# Patient Record
Sex: Male | Born: 1960
Health system: Midwestern US, Community
[De-identification: ages and names within clinical notes are randomized; demographics above are authoritative.]

## PROBLEM LIST (undated history)

## (undated) DIAGNOSIS — E079 Disorder of thyroid, unspecified: Secondary | ICD-10-CM

## (undated) DIAGNOSIS — K5792 Diverticulitis of intestine, part unspecified, without perforation or abscess without bleeding: Secondary | ICD-10-CM

## (undated) DIAGNOSIS — I10 Essential (primary) hypertension: Secondary | ICD-10-CM

## (undated) DIAGNOSIS — J45909 Unspecified asthma, uncomplicated: Secondary | ICD-10-CM

## (undated) DIAGNOSIS — K219 Gastro-esophageal reflux disease without esophagitis: Secondary | ICD-10-CM

## (undated) HISTORY — PX: ROTATOR CUFF REPAIR: SHX139

---

## 2002-09-14 ENCOUNTER — Encounter: Payer: Self-pay | Admitting: Internal Medicine

## 2002-09-14 ENCOUNTER — Encounter: Admission: RE | Admit: 2002-09-14 | Discharge: 2002-09-14 | Payer: Self-pay | Admitting: Internal Medicine

## 2015-06-14 ENCOUNTER — Ambulatory Visit: Admit: 2015-06-14 | Payer: MEDICARE | Attending: Anesthesiology

## 2015-06-14 DIAGNOSIS — M5412 Radiculopathy, cervical region: Secondary | ICD-10-CM

## 2015-06-14 NOTE — Progress Notes (Signed)
Frank Richardson  Dec 25, 1960  [id number]    HPI Comments: Pain returning after 2 mos S/P epidurals.Pain still gone in right arm, left persists. Here to discuss options.    Neck Pain    This is a chronic problem. The current episode started more than 1 year ago. The problem occurs daily. The problem has been gradually worsening. The pain is associated with nothing. The pain is present in the midline, left side and right side. The quality of the pain is described as stabbing and aching. The pain is at a severity of 3/10. The pain is mild. The symptoms are aggravated by position. The pain is same all the time. Stiffness is present all day. Pertinent negatives include no chest pain, headaches, numbness, tingling or weakness. Treatments tried: epidurals. The treatment provided moderate relief.     []  OARRS Reviewed  []  Consistent Results  []  Inconsistent Results    []  Drug Screen Consistent Results  []  Drug Screen Inconsistent Results      ALLERGIES: Patients list of allergies were reviewed     MEDICATIONS: Mr. Lipinski list of medications were reviewed.    SOCIAL/FAMILY/PAST MEDICAL HISTORY: Mr. Vanderwerf social, family and past medical history was reviewed.     REVIEW OF SYSTEMS:    Review of Systems   Constitutional: Negative for activity change, appetite change and fatigue.   Respiratory: Negative for apnea and shortness of breath.    Cardiovascular: Negative for chest pain.   Musculoskeletal: Positive for neck pain.   Neurological: Negative for tingling, weakness, numbness and headaches.   Psychiatric/Behavioral: Negative for agitation, behavioral problems, confusion, decreased concentration and sleep disturbance.          PHYSICAL EXAM:   Vitals reviewed.   Visit Vitals   ??? BP (!) 180/98   ??? Ht 5\' 10"  (1.778 m)   ??? Wt 198 lb (89.8 kg)   ??? BMI 28.41 kg/m2     Physical Exam   Constitutional: He appears well-developed and well-nourished. He appears distressed.   No signs of intoxication   Eyes: Pupils are equal, round, and  reactive to light.   Neck: Spinous process tenderness and muscular tenderness present. Rigidity present. Decreased range of motion present.   Cardiovascular: Normal rate and regular rhythm.    Murmur heard.  Pulmonary/Chest: Effort normal and breath sounds normal.   Musculoskeletal:        Cervical back: He exhibits decreased range of motion, tenderness, pain and spasm.   Neurological: He is alert. He has normal strength. He displays no atrophy. No sensory deficit. He exhibits normal muscle tone. He displays a negative Romberg sign. Gait abnormal.   Reflex Scores:       Tricep reflexes are 2+ on the right side and 2+ on the left side.       Bicep reflexes are 2+ on the right side and 2+ on the left side.       Brachioradialis reflexes are 2+ on the right side and 2+ on the left side.  Psychiatric: He has a normal mood and affect. His behavior is normal.   Vitals reviewed.      IMPRESSION:   1. Cervical radiculitis    2. Postlaminectomy syndrome, cervical region    3. Spinal stenosis in cervical region    4. Spondylosis of cervical region without myelopathy or radiculopathy        PLAN:    Discussed doing a trial of facet nerve blocks, SCS trial and surgical consultation. Will  proceed with facet blocks first.    Informed verbal consent was obtained     Current Outpatient Prescriptions   Medication Sig Dispense Refill   ??? aspirin 81 MG tablet Take 81 mg by mouth daily     ??? Levothyroxine Sodium 88 MCG CAPS Take by mouth Daily     ??? omeprazole (PRILOSEC) 20 MG capsule Take 20 mg by mouth daily     ??? atorvastatin (LIPITOR) 80 MG tablet Take 80 mg by mouth daily     ??? temazepam (RESTORIL) 15 MG capsule Take 15 mg by mouth nightly as needed for Sleep     ??? HYDROcodone-acetaminophen (NORCO) 10-325 MG per tablet Take 1 tablet by mouth every 6 hours as needed for Pain       No current facility-administered medications for this visit.      I will continue his current medication regimen  which is part of the above treatment  schedule. It has been helping with Mr. Stern chronic  medical problems which for this visit include:   The primary encounter diagnosis was Cervical radiculitis. Diagnoses of Postlaminectomy syndrome, cervical region, Spinal stenosis in cervical region, and Spondylosis of cervical region without myelopathy or radiculopathy were also pertinent to this visit.     He was advised home exercise program with a stretching/ROM routine as tolerated. Risks and benefits of the medications and other alternative treatments  including no treatment were discussed with the patient.The common side effects of these medications were also explained to the patient.  Informed verbal consent was obtained.   Goals of current treatment regimen include improvement in pain, restoration of functioning- with focus on improvement in physical performance, general activity, work or disability,emotional distress, health care utilization and  decreased medication consumption. Will continue to monitor progress towards achieving/maintaining therapeutic goals with special emphasis on  1. Improvement in perceived interfernce  of pain with ADL's. Ability to do home exercises independently. Ability to do household chores indoor and/or outdoor work and social and leisure activities.Improve psychosocial and physical functioning.   2.   There are no discontinued medications.    He was advised against drinking alcohol with the narcotic pain medicines, advised against driving or handling machinery while adjusting the dose of medicines or if having cognitive  issues related to the current medications.Risk of overdose and death, if medicines not taken as prescribed, were also discussed. If the patient develops new symptoms or if the symptoms worsen, the patient should call the office.    While transcribing every attempt was made to maintain the accuracy of the note in terms of it's contents,there may have been some errors made inadvertently.  Thank you for  allowing me to participate in the care of this patient.        Trampus Mcquerry J,Emslee Lopezmartinez MD.[email or web address].FIPP.    Cc: Dr.No primary care provider on file.

## 2017-12-29 ENCOUNTER — Emergency Department (HOSPITAL_BASED_OUTPATIENT_CLINIC_OR_DEPARTMENT_OTHER): Payer: BLUE CROSS/BLUE SHIELD

## 2017-12-29 ENCOUNTER — Emergency Department (HOSPITAL_BASED_OUTPATIENT_CLINIC_OR_DEPARTMENT_OTHER)
Admission: EM | Admit: 2017-12-29 | Discharge: 2017-12-29 | Disposition: A | Discharge status: Home / Self Care | Payer: BLUE CROSS/BLUE SHIELD | Attending: Emergency Medicine | Admitting: Emergency Medicine

## 2017-12-29 ENCOUNTER — Encounter (HOSPITAL_BASED_OUTPATIENT_CLINIC_OR_DEPARTMENT_OTHER): Payer: Self-pay | Admitting: Emergency Medicine

## 2017-12-29 ENCOUNTER — Other Ambulatory Visit: Payer: Self-pay

## 2017-12-29 DIAGNOSIS — R2241 Localized swelling, mass and lump, right lower limb: Secondary | ICD-10-CM | POA: Insufficient documentation

## 2017-12-29 DIAGNOSIS — J45901 Unspecified asthma with (acute) exacerbation: Secondary | ICD-10-CM | POA: Insufficient documentation

## 2017-12-29 DIAGNOSIS — M7989 Other specified soft tissue disorders: Secondary | ICD-10-CM

## 2017-12-29 DIAGNOSIS — I10 Essential (primary) hypertension: Secondary | ICD-10-CM | POA: Diagnosis not present

## 2017-12-29 DIAGNOSIS — J101 Influenza due to other identified influenza virus with other respiratory manifestations: Secondary | ICD-10-CM | POA: Diagnosis not present

## 2017-12-29 DIAGNOSIS — R0602 Shortness of breath: Secondary | ICD-10-CM | POA: Diagnosis present

## 2017-12-29 DIAGNOSIS — Z79899 Other long term (current) drug therapy: Secondary | ICD-10-CM | POA: Insufficient documentation

## 2017-12-29 DIAGNOSIS — J111 Influenza due to unidentified influenza virus with other respiratory manifestations: Secondary | ICD-10-CM

## 2017-12-29 DIAGNOSIS — R2242 Localized swelling, mass and lump, left lower limb: Secondary | ICD-10-CM | POA: Insufficient documentation

## 2017-12-29 HISTORY — DX: Gastro-esophageal reflux disease without esophagitis: K21.9

## 2017-12-29 HISTORY — DX: Disorder of thyroid, unspecified: E07.9

## 2017-12-29 HISTORY — DX: Essential (primary) hypertension: I10

## 2017-12-29 LAB — COMPREHENSIVE METABOLIC PANEL
ALBUMIN: 3.7 g/dL (ref 3.5–5.0)
ALK PHOS: 69 U/L (ref 38–126)
ALT: 26 U/L (ref 17–63)
AST: 29 U/L (ref 15–41)
Anion gap: 10 (ref 5–15)
BUN: 15 mg/dL (ref 6–20)
CHLORIDE: 100 mmol/L — AB (ref 101–111)
CO2: 27 mmol/L (ref 22–32)
CREATININE: 1.19 mg/dL (ref 0.61–1.24)
Calcium: 8.7 mg/dL — ABNORMAL LOW (ref 8.9–10.3)
GFR calc non Af Amer: >60 mL/min (ref >60)
GLUCOSE: 117 mg/dL — AB (ref 65–99)
Potassium: 3.8 mmol/L (ref 3.5–5.1)
SODIUM: 137 mmol/L (ref 135–145)
Total Bilirubin: 0.4 mg/dL (ref 0.3–1.2)
Total Protein: 7 g/dL (ref 6.5–8.1)

## 2017-12-29 LAB — CBC WITH DIFFERENTIAL/PLATELET
Basophils Absolute: 0 10*3/uL (ref 0.0–0.1)
Basophils Relative: 0 %
EOS ABS: 0.1 10*3/uL (ref 0.0–0.7)
Eosinophils Relative: 1 %
HEMATOCRIT: 47 % (ref 39.0–52.0)
HEMOGLOBIN: 15.6 g/dL (ref 13.0–17.0)
LYMPHS ABS: 1.2 10*3/uL (ref 0.7–4.0)
Lymphocytes Relative: 19 %
MCH: 27.2 pg (ref 26.0–34.0)
MCHC: 33.2 g/dL (ref 30.0–36.0)
MCV: 82 fL (ref 78.0–100.0)
MONO ABS: 0.8 10*3/uL (ref 0.1–1.0)
MONOS PCT: 13 %
Neutro Abs: 4.2 10*3/uL (ref 1.7–7.7)
Neutrophils Relative %: 67 %
Platelets: 191 10*3/uL (ref 150–400)
RBC: 5.73 MIL/uL (ref 4.22–5.81)
RDW: 14.9 % (ref 11.5–15.5)
WBC: 6.2 10*3/uL (ref 4.0–10.5)

## 2017-12-29 LAB — INFLUENZA PANEL BY PCR (TYPE A & B)
INFLAPCR: POSITIVE — AB
Influenza B By PCR: NEGATIVE

## 2017-12-29 LAB — I-STAT CG4 LACTIC ACID, ED: LACTIC ACID, VENOUS: 1.65 mmol/L (ref 0.5–1.9)

## 2017-12-29 MED ORDER — IPRATROPIUM BROMIDE 0.02 % IN SOLN
0.5000 mg | Freq: Once | RESPIRATORY_TRACT | Status: AC
  Administered 2017-12-29: 0.5 mg via RESPIRATORY_TRACT
  Filled 2017-12-29: qty 2.5

## 2017-12-29 MED ORDER — OSELTAMIVIR PHOSPHATE 75 MG PO CAPS
75.0000 mg | ORAL_CAPSULE | Freq: Once | ORAL | Status: AC
  Administered 2017-12-29: 75 mg via ORAL
  Filled 2017-12-29: qty 1

## 2017-12-29 MED ORDER — ALBUTEROL SULFATE (2.5 MG/3ML) 0.083% IN NEBU
2.5000 mg | INHALATION_SOLUTION | Freq: Once | RESPIRATORY_TRACT | Status: AC
  Administered 2017-12-29: 2.5 mg via RESPIRATORY_TRACT

## 2017-12-29 MED ORDER — SODIUM CHLORIDE 0.9 % IV BOLUS (SEPSIS)
1000.0000 mL | Freq: Once | INTRAVENOUS | Status: AC
  Administered 2017-12-29: 1000 mL via INTRAVENOUS

## 2017-12-29 MED ORDER — ACETAMINOPHEN 325 MG PO TABS
650.0000 mg | ORAL_TABLET | Freq: Once | ORAL | Status: AC
  Administered 2017-12-29: 650 mg via ORAL
  Filled 2017-12-29: qty 2

## 2017-12-29 MED ORDER — BENZONATATE 100 MG PO CAPS: 100.0000 mg | ORAL_CAPSULE | Freq: Three times a day (TID) | ORAL | 0 refills | Status: DC

## 2017-12-29 MED ORDER — ALBUTEROL SULFATE (2.5 MG/3ML) 0.083% IN NEBU
INHALATION_SOLUTION | RESPIRATORY_TRACT | Status: AC
  Administered 2017-12-29: 2.5 mg via RESPIRATORY_TRACT
  Filled 2017-12-29: qty 3

## 2017-12-29 MED ORDER — PREDNISONE 20 MG PO TABS: 40.0000 mg | ORAL_TABLET | Freq: Every day | ORAL | 0 refills | Status: DC

## 2017-12-29 MED ORDER — AEROCHAMBER PLUS FLO-VU SMALL MISC
1.0000 | Freq: Once | Status: AC
  Administered 2017-12-29: 1
  Filled 2017-12-29: qty 1

## 2017-12-29 MED ORDER — OSELTAMIVIR PHOSPHATE 75 MG PO CAPS: 75.0000 mg | ORAL_CAPSULE | Freq: Two times a day (BID) | ORAL | 0 refills | Status: DC

## 2017-12-29 MED ORDER — ALBUTEROL (5 MG/ML) CONTINUOUS INHALATION SOLN
10.0000 mg/h | INHALATION_SOLUTION | Freq: Once | RESPIRATORY_TRACT | Status: AC
  Administered 2017-12-29: 10 mg/h via RESPIRATORY_TRACT
  Filled 2017-12-29: qty 20

## 2017-12-29 MED ORDER — METHYLPREDNISOLONE SODIUM SUCC 125 MG IJ SOLR
125.0000 mg | Freq: Once | INTRAMUSCULAR | Status: AC
  Administered 2017-12-29: 125 mg via INTRAVENOUS
  Filled 2017-12-29: qty 2

## 2017-12-29 MED ORDER — IPRATROPIUM-ALBUTEROL 0.5-2.5 (3) MG/3ML IN SOLN
RESPIRATORY_TRACT | Status: AC
  Administered 2017-12-29: 3 mL via RESPIRATORY_TRACT
  Filled 2017-12-29: qty 3

## 2017-12-29 MED ORDER — IPRATROPIUM-ALBUTEROL 0.5-2.5 (3) MG/3ML IN SOLN
3.0000 mL | Freq: Once | RESPIRATORY_TRACT | Status: AC
  Administered 2017-12-29: 3 mL via RESPIRATORY_TRACT

## 2017-12-29 MED ORDER — ALBUTEROL SULFATE HFA 108 (90 BASE) MCG/ACT IN AERS
2.0000 | INHALATION_SPRAY | RESPIRATORY_TRACT | Status: DC | PRN
  Administered 2017-12-29: 2 via RESPIRATORY_TRACT
  Filled 2017-12-29: qty 6.7

## 2017-12-29 NOTE — ED Triage Notes (Signed)
SOB, cough, fever x 2 days.

## 2017-12-29 NOTE — ED Provider Notes (Signed)
[facility] EMERGENCY DEPARTMENT Provider Note   CSN: [id number] Arrival date & time: 12/29/17  1107     History   Chief Complaint Chief Complaint  Patient presents with  . Shortness of Breath    HPI Wayne RammingRobert M Gross is a 57 y.o. male.  Patient presents the emergency department today with complaints of shortness of breath, fevers over the past 3 days.  Patient also has a history of asthma but states that he has very infrequent exacerbations but does have albuterol at home.  Patient has had persistent coughing as well with pain across his lower chest with coughing.  No nausea, vomiting, or diarrhea.  Patient states that it feels like he cannot get enough air in.  No ear pain, runny nose, sore throat.  Patient does state that for several weeks he has had bilateral lower extremity swelling and tenderness after he sits down in the evening after working.  No history of blood clots.  He has not a smoker.  The onset of this condition was acute. The course is constant. Aggravating factors: none. Alleviating factors: none.        Past Medical History:  Diagnosis Date  . GERD (gastroesophageal reflux disease)   . Hypertension   . Thyroid disease     There are no active problems to display for this patient.   Past Surgical History:  Procedure Laterality Date  . ROTATOR CUFF REPAIR Right        Home Medications    Prior to Admission medications   Medication Sig Start Date End Date Taking? Authorizing Provider  levothyroxine (SYNTHROID, LEVOTHROID) 200 MCG tablet Take 200 mcg by mouth daily before breakfast.   Yes [provider]    Family History No family history on file.  Social History Social History   Tobacco Use  . Smoking status: Never Smoker  . Smokeless tobacco: Never Used  Substance Use Topics  . Alcohol use: Yes  . Drug use: Not on file     Allergies   Patient has no known allergies.   Review of Systems Review of Systems    Constitutional: Positive for chills, fatigue and fever.  HENT: Positive for congestion. Negative for rhinorrhea and sore throat.   Eyes: Negative for redness.  Respiratory: Positive for cough and shortness of breath.   Cardiovascular: Positive for leg swelling. Negative for chest pain.  Gastrointestinal: Negative for abdominal pain, diarrhea, nausea and vomiting.  Genitourinary: Negative for dysuria.  Musculoskeletal: Positive for myalgias.  Skin: Negative for rash.  Neurological: Negative for headaches.     Physical Exam Updated Vital Signs BP (!) 143/96   Pulse (!) 113   Temp 99.1 F (37.3 C) (Oral)   Resp 14   Ht 6' (1.829 m)   Wt 99.8 kg (220 lb)   SpO2 100%   BMI 29.84 kg/m   Physical Exam  Constitutional: He appears well-developed and well-nourished.  HENT:  Head: Normocephalic and atraumatic.  Right Ear: Tympanic membrane, external ear and ear canal normal.  Left Ear: Tympanic membrane, external ear and ear canal normal.  Nose: Mucosal edema present. No rhinorrhea.  Mouth/Throat: Uvula is midline, oropharynx is clear and moist and mucous membranes are normal. Mucous membranes are not dry. No trismus in the jaw. No uvula swelling. No oropharyngeal exudate, posterior oropharyngeal edema, posterior oropharyngeal erythema or tonsillar abscesses.  Eyes: Conjunctivae are normal. Right eye exhibits no discharge. Left eye exhibits no discharge.  Neck: Normal range of motion. Neck supple.  Cardiovascular: Regular rhythm and normal heart sounds.  No murmur heard. Tachycardic, just received albuterol treatment.  Pulmonary/Chest: Effort normal. No respiratory distress. He has wheezes (Moderate inspiratory/expiratory wheezing throughout). He has no rales. He exhibits tenderness (Over bilateral lower chest wall).  Abdominal: Soft. There is no tenderness. There is no rebound and no guarding.  Musculoskeletal: He exhibits no edema or tenderness.  No clinical signs of DVT.   Neurological: He is alert.  Skin: Skin is warm and dry.  Psychiatric: He has a normal mood and affect.  Nursing note and vitals reviewed.    ED Treatments / Results  Labs (all labs ordered are listed, but only abnormal results are displayed) Labs Reviewed  COMPREHENSIVE METABOLIC PANEL - Abnormal; Notable for the following components:      Result Value   Chloride 100 (*)    Glucose, Bld 117 (*)    Calcium 8.7 (*)    All other components within normal limits  INFLUENZA PANEL BY PCR (TYPE A & B) - Abnormal; Notable for the following components:   Influenza A By PCR POSITIVE (*)    All other components within normal limits  CBC WITH DIFFERENTIAL/PLATELET  I-STAT CG4 LACTIC ACID, ED    ED ECG REPORT   Date: 12/29/2017  Rate: 114  Rhythm: sinus tachycardia  QRS Axis: left  Intervals: normal  ST/T Wave abnormalities: normal  Conduction Disutrbances:none  Narrative Interpretation:   Old EKG Reviewed: none available  I have personally reviewed the EKG tracing and agree with the computerized printout as noted.  Radiology No results found.  Procedures Procedures (including critical care time)  Medications Ordered in ED Medications  ipratropium-albuterol (DUONEB) 0.5-2.5 (3) MG/3ML nebulizer solution 3 mL (3 mLs Nebulization Given 12/29/17 1132)  albuterol (PROVENTIL) (2.5 MG/3ML) 0.083% nebulizer solution 2.5 mg (2.5 mg Nebulization Given 12/29/17 1132)     Initial Impression / Assessment and Plan / ED Course  I have reviewed the triage vital signs and the nursing notes.  Pertinent labs & imaging results that were available during my care of the patient were reviewed by me and considered in my medical decision making (see chart for details).     Patient seen and examined. Work-up initiated. Medications ordered. Continued wheezing after 2 treatments.   Vital signs reviewed and are as follows: BP (!) 143/96   Pulse (!) 113   Temp 99.1 F (37.3 C) (Oral)   Resp 14    Ht 6' (1.829 m)   Wt 99.8 kg (220 lb)   SpO2 100%   BMI 29.84 kg/m   1:33 PM patient rechecked.  Continues wheezing and feeling short of breath.  Will place on continuous nebulizer treatment and give IV steroids.  Awaiting flu test and results of ultrasound.  Patient and wife informed of lab work and CXR results.   3:32 PM Pt completed hr long treatment. Ambulated with O2 sat at 95-96%. HR was 120 after albuterol, to 135 at end of ambulation.  Discussed patient with Dr. Clarene Duke who will see.   4:57 PM patient discussed with and seen by Dr. Clarene Duke.  Better after hour-long continuous nebulizer treatment.  He appears more comfortable.  Flu test is positive for influenza A.  Will start on Tamiflu.  Patient will be discharged home with Tessalon, albuterol inhaler, prednisone times 4 days.  Patient encouraged to rest for the next 2 days and follow-up with his doctor for recheck.  Patient discharged to home. Encouraged to rest and drink plenty of fluids.  Patient told to return to ED or see their primary doctor if their symptoms worsen, high fever not controlled with tylenol, persistent vomiting, they feel they are dehydrated, or if they have any other concerns.  Patient verbalized understanding and agreed with plan.      Final Clinical Impressions(s) / ED Diagnoses   Final diagnoses:  Exacerbation of asthma, unspecified asthma severity, unspecified whether persistent  Influenza   Patient with exacerbation of asthma and wheezing, shortness of breath in setting of influenza A.  Patient tested and treated as above.  At time of discharge he is maintaining good oxygen saturations with improved lung exam and subjective improvement.  Do not suspect ACS or PE.  Return instructions as above.  ED Discharge Orders        Ordered    benzonatate (TESSALON) 100 MG capsule  Every 8 hours     12/29/17 1642    predniSONE (DELTASONE) 20 MG tablet  Daily     12/29/17 1642    oseltamivir (TAMIFLU) 75 MG  capsule  Every 12 hours     12/29/17 1642       Renne Crigler, PA-C 12/29/17 1659    Renne Crigler, PA-C 12/29/17 1701    Little, Ambrose Finland, MD 12/30/17 1552

## 2017-12-29 NOTE — ED Notes (Signed)
ED Provider at bedside. 

## 2017-12-29 NOTE — ED Notes (Addendum)
AMBULATING: SpO2 95-96% and HR maintained 130-134bpm. PA aware.  Pt remains on cardiac monitoring and auto VS

## 2017-12-29 NOTE — ED Notes (Signed)
Pt on cardiac monitor and auto VS 

## 2017-12-29 NOTE — Discharge Instructions (Signed)
Please read and follow all provided instructions.  Your diagnoses today include:  1. Exacerbation of asthma, unspecified asthma severity, unspecified whether persistent   2. Leg swelling   3. Viral upper respiratory illness     Tests performed today include:  Chest x-ray -no pneumonia  Blood counts and electrolytes  Test for systemic sepsis -negative  Ultrasound for blood clots in the legs -no clots found  Test for flu -pending, I will call if your result is positive  Vital signs. See below for your results today.   Medications prescribed:   Albuterol inhaler - medication that opens up your airway  Use inhaler as follows: 1-2 puffs with spacer every 4 hours as needed for wheezing, cough, or shortness of breath.    Prednisone - steroid medicine   It is best to take this medication in the morning to prevent sleeping problems. If you are diabetic, monitor your blood sugar closely and stop taking Prednisone if blood sugar is over 300. Take with food to prevent stomach upset.    Tessalon Perles - cough suppressant medication   Tamiflu - medication for influenza  This medication, when taken within the first 48 hours of illness, may help decrease the severity of the flu and cause symptoms to improve approximately 12 hours sooner. About 1 out of 5 patients may have diarrhea and vomiting from this medication.   Take any prescribed medications only as directed.  Home care instructions:  Follow any educational materials contained in this packet.  BE VERY CAREFUL not to take multiple medicines containing Tylenol (also called acetaminophen). Doing so can lead to an overdose which can damage your liver and cause liver failure and possibly death.   Follow-up instructions: Please follow-up with your primary care provider in the next 2-3 days for further evaluation of your symptoms.   Return instructions:   Please return to the Emergency Department if you experience worsening  symptoms.   Return with chest pain, worsening shortness of breath, persistent vomiting, lightheadedness or passing out  Please return if you have any other emergent concerns.  Additional Information:  Your vital signs today were: BP 130/69 (BP Location: Left Arm)    Pulse (!) 120    Temp 99.1 F (37.3 C) (Oral)    Resp 20    Ht 6' (1.829 m)    Wt 99.8 kg (220 lb)    SpO2 97%    BMI 29.84 kg/m  If your blood pressure (BP) was elevated above 135/85 this visit, please have this repeated by your doctor within one month. --------------

## 2019-06-07 IMAGING — US US EXTREM LOW VENOUS BILAT
1 series · 13 of 24 positions shown · non-contrast
Comparison: None.

CLINICAL DATA: Bilateral calf swelling for 6 months.



[Series 1: us extrem low venous bilat · 0.08mm/px · 13 of 50 slices shown]
[im 1/50]
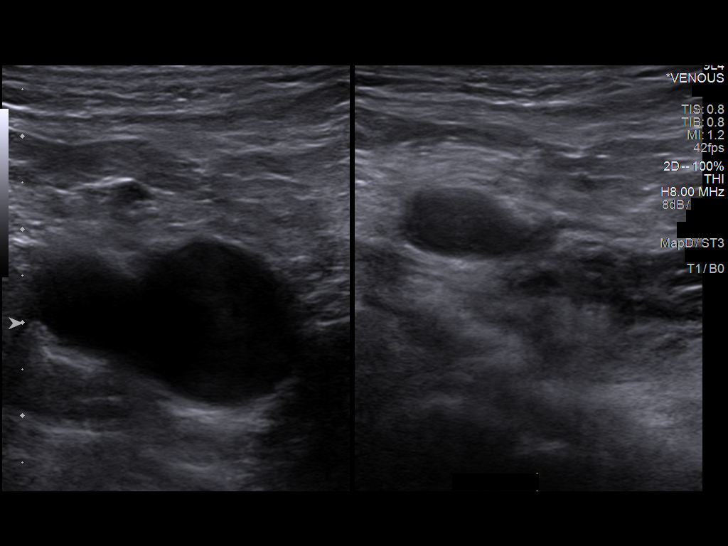
[im 5/50]
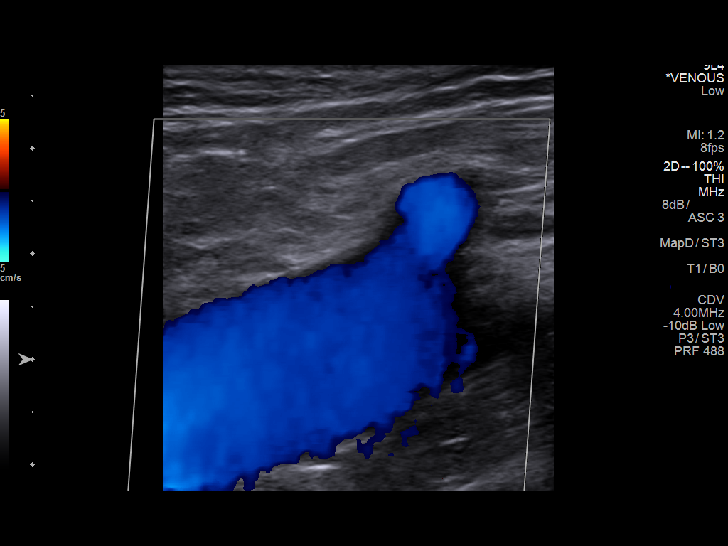
[im 9/50]
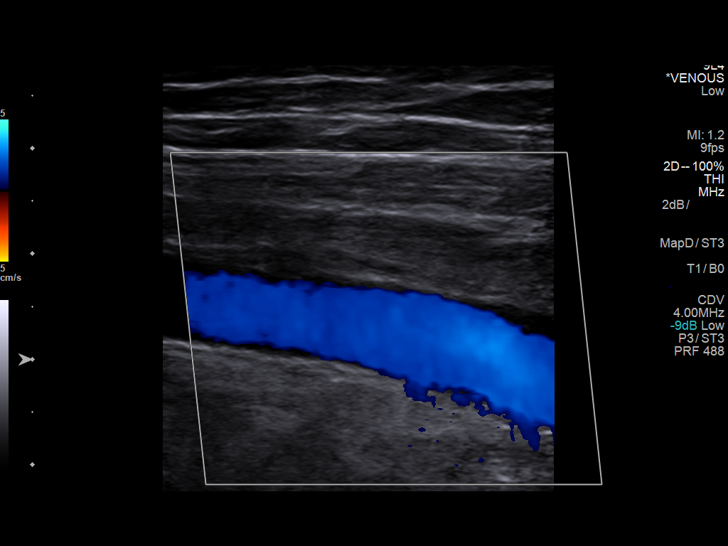
[im 13/50]
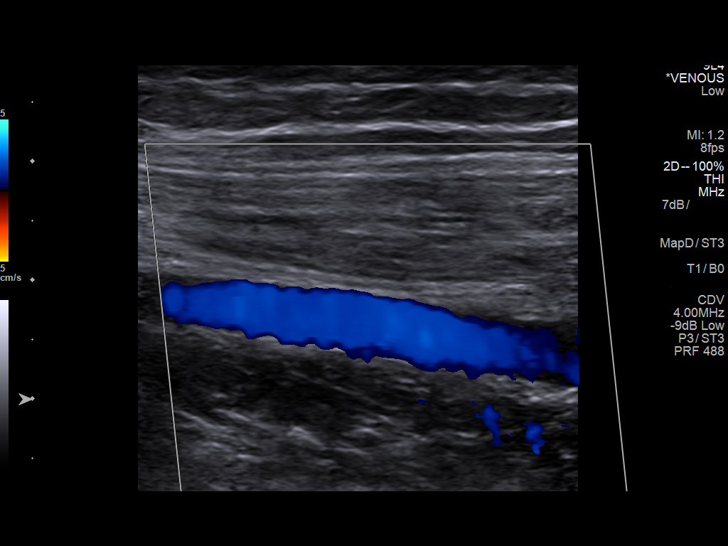
[im 18/50]
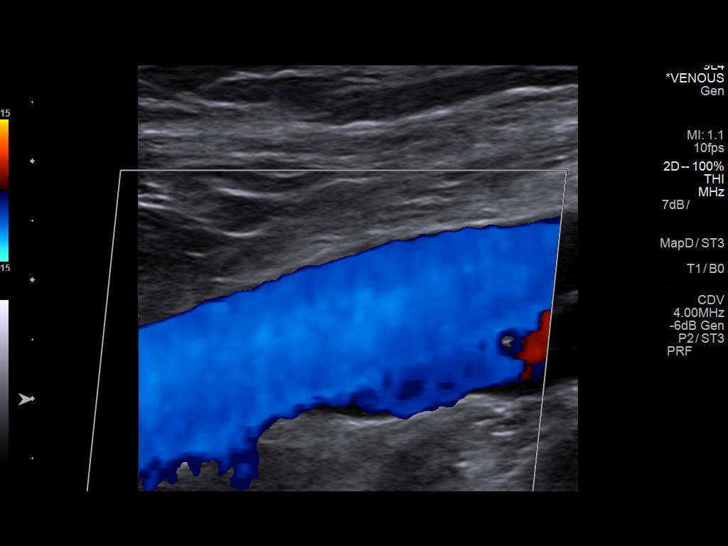
[im 22/50]
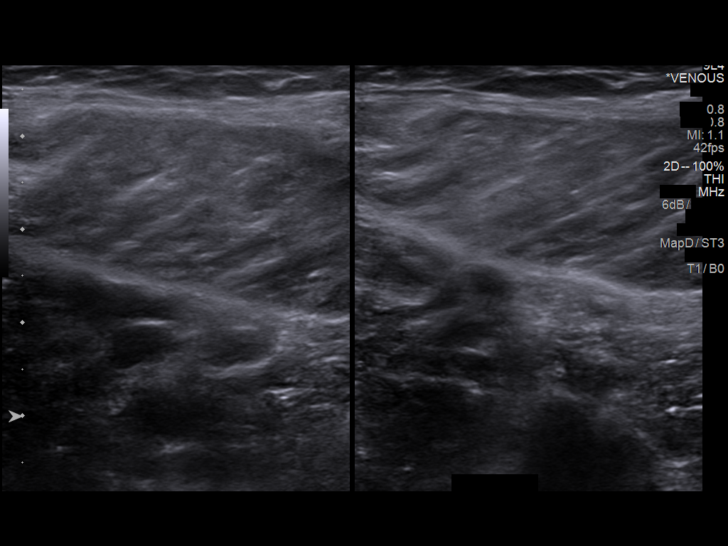
[im 26/50]
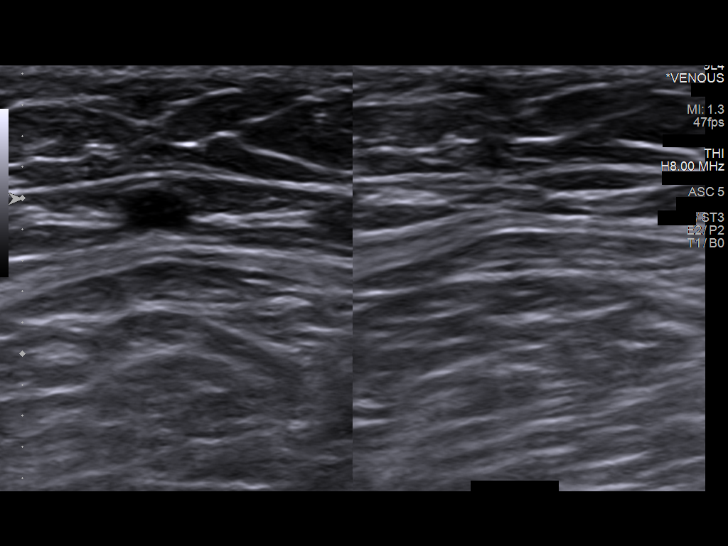
[im 28/50]
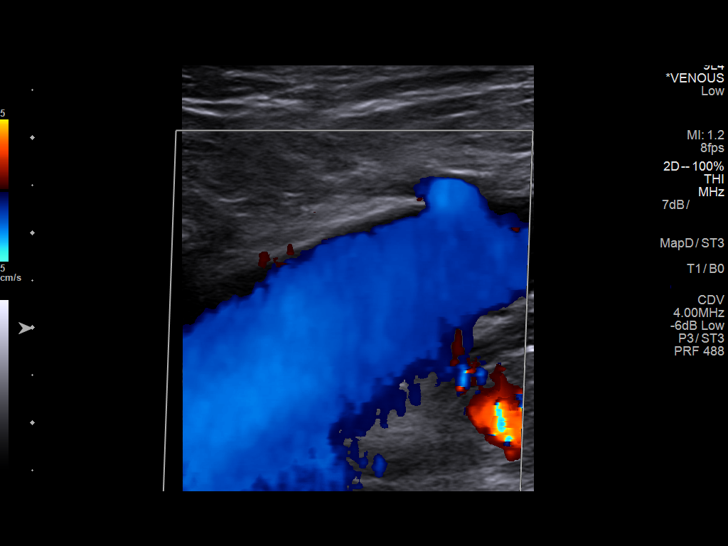
[im 32/50]
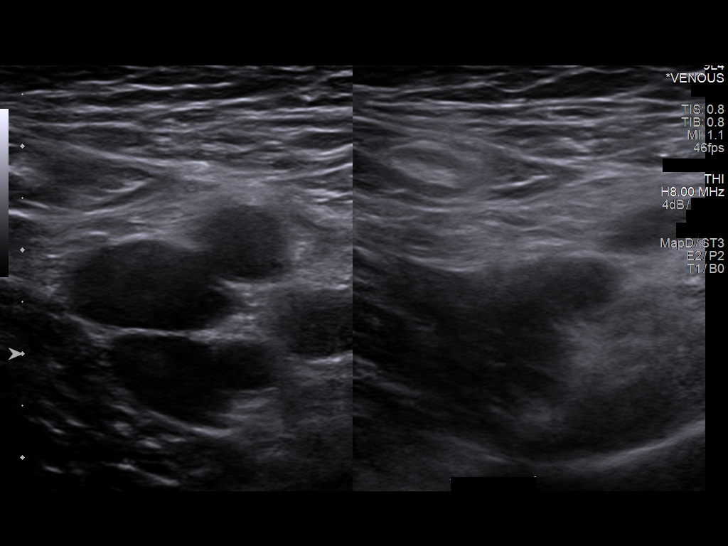
[im 37/50]
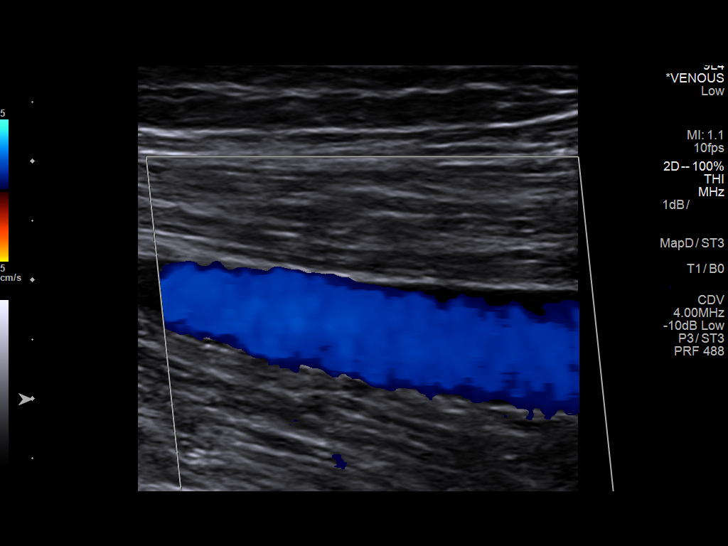
[im 41/50]
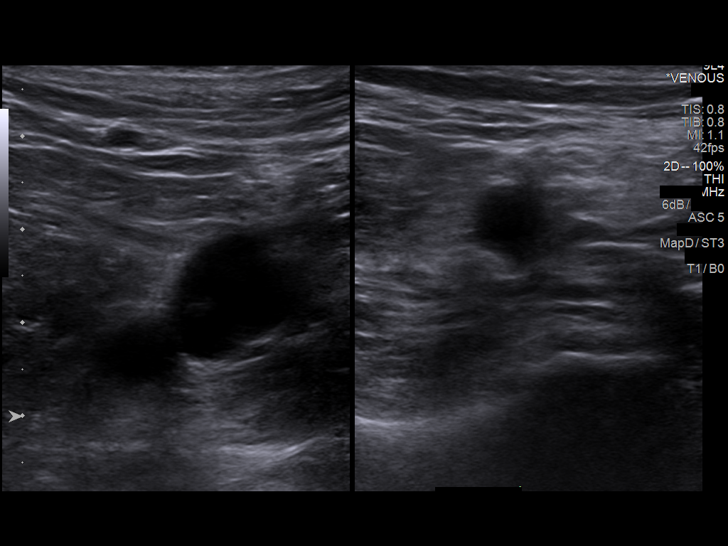
[im 45/50]
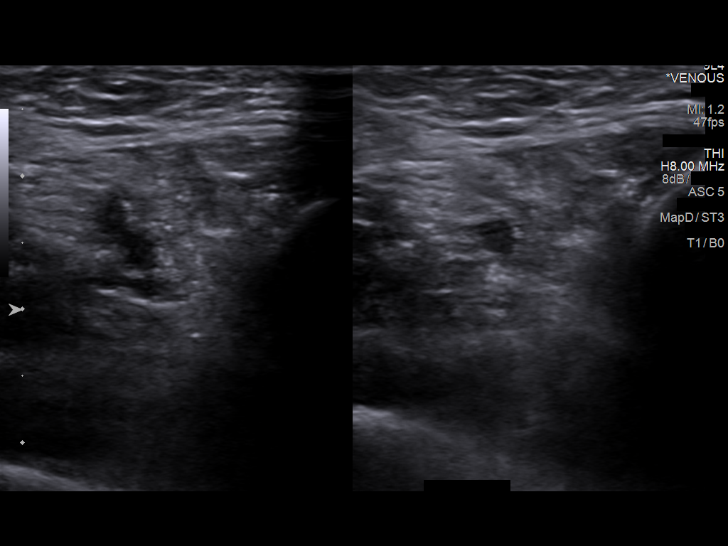
[im 50/50]
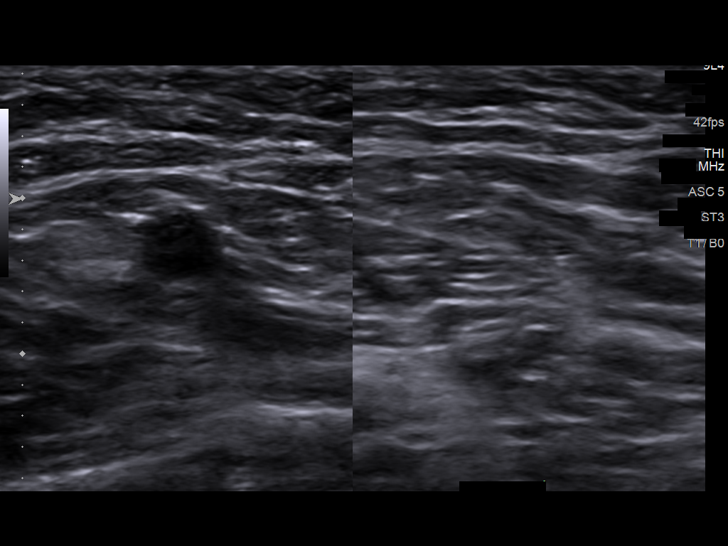

[13 of 24 positions shown; findings below may reference images not displayed]

FINDINGS: RIGHT LOWER EXTREMITY

Common Femoral Vein: No evidence of thrombus. Normal
compressibility, respiratory phasicity and response to augmentation.

Saphenofemoral Junction: No evidence of thrombus. Normal
compressibility and flow on color Doppler imaging.

Profunda Femoral Vein: No evidence of thrombus. Normal
compressibility and flow on color Doppler imaging.

Femoral Vein: No evidence of thrombus. Normal compressibility,
respiratory phasicity and response to augmentation.

Popliteal Vein: No evidence of thrombus. Normal compressibility,
respiratory phasicity and response to augmentation.

Calf Veins: No evidence of thrombus. Normal compressibility and flow
on color Doppler imaging.

Superficial Great Saphenous Vein: No evidence of thrombus. Normal
compressibility.

Venous Reflux:  None.

Other Findings:  None.

LEFT LOWER EXTREMITY

Common Femoral Vein: No evidence of thrombus. Normal
compressibility, respiratory phasicity and response to augmentation.

Saphenofemoral Junction: No evidence of thrombus. Normal
compressibility and flow on color Doppler imaging.

Profunda Femoral Vein: No evidence of thrombus. Normal
compressibility and flow on color Doppler imaging.

Femoral Vein: No evidence of thrombus. Normal compressibility,
respiratory phasicity and response to augmentation.

Popliteal Vein: No evidence of thrombus. Normal compressibility,
respiratory phasicity and response to augmentation.

Calf Veins: No evidence of thrombus. Normal compressibility and flow
on color Doppler imaging.

Superficial Great Saphenous Vein: No evidence of thrombus. Normal
compressibility.

Venous Reflux:  None.

Other Findings:  None.
IMPRESSION: No evidence of deep venous thrombosis.

## 2020-04-12 ENCOUNTER — Other Ambulatory Visit: Payer: Self-pay

## 2020-04-12 ENCOUNTER — Emergency Department (INDEPENDENT_AMBULATORY_CARE_PROVIDER_SITE_OTHER): Payer: BC Managed Care – PPO

## 2020-04-12 ENCOUNTER — Emergency Department
Admission: EM | Admit: 2020-04-12 | Discharge: 2020-04-12 | Disposition: A | Discharge status: Home / Self Care | Payer: BLUE CROSS/BLUE SHIELD | Source: Home / Self Care | Attending: Family Medicine | Admitting: Family Medicine

## 2020-04-12 DIAGNOSIS — R109 Unspecified abdominal pain: Secondary | ICD-10-CM | POA: Diagnosis not present

## 2020-04-12 DIAGNOSIS — R1032 Left lower quadrant pain: Secondary | ICD-10-CM

## 2020-04-12 LAB — POCT CBC W AUTO DIFF (K'VILLE URGENT CARE)

## 2020-04-12 LAB — POCT URINALYSIS DIP (MANUAL ENTRY)
Bilirubin, UA: NEGATIVE
Blood, UA: NEGATIVE
Glucose, UA: NEGATIVE mg/dL
Leukocytes, UA: NEGATIVE
Nitrite, UA: NEGATIVE
Protein Ur, POC: 30 mg/dL — AB
Spec Grav, UA: 1.025 (ref 1.010–1.025)
Urobilinogen, UA: 0.2 E.U./dL
pH, UA: 7 (ref 5.0–8.0)

## 2020-04-12 MED ORDER — ONDANSETRON HCL 4 MG/2ML IJ SOLN
4.0000 mg | Freq: Once | INTRAMUSCULAR | Status: AC
  Administered 2020-04-12: 4 mg via INTRAMUSCULAR

## 2020-04-12 MED ORDER — ONDANSETRON 4 MG PO TBDP: ORAL_TABLET | ORAL | 0 refills | Status: DC

## 2020-04-12 MED ORDER — AMOXICILLIN-POT CLAVULANATE 875-125 MG PO TABS: ORAL_TABLET | ORAL | 0 refills | Status: DC

## 2020-04-12 NOTE — ED Provider Notes (Signed)
[facility]    CSN: [id number] Arrival date & time: 04/12/20  1607      History   Chief Complaint Chief Complaint  Patient presents with  . Emesis  . Diarrhea    HPI Wayne Gross is a 59 y.o. male.   At about noon today while at work, patient ate a bag of Doritos and subsequently developed nausea/vomiting with diarrhea.  He has felt fatigued today with persistent abdominal bloating.  He also reports that he did not feel quite well yesterday afternoon prior to going to a cook-out, but others at the gathering have not become ill.  He has had chills today but denies fever.  He denies recent foreign travel, or drinking untreated water in a wilderness environment.  He has noticed that his urine is darker than usual, but he has had no urinary symptoms. Past history:  COVID19 infection 2 to 3 months ago.  Diverticulitis twice.   The history is provided by the patient.  Abdominal Pain Pain location:  Generalized Pain quality: bloating and cramping   Pain radiates to:  Does not radiate Pain severity:  Moderate Onset quality:  Gradual Duration:  5 hours Timing:  Intermittent Progression:  Waxing and waning Chronicity:  New Context: eating   Context: not alcohol use, not diet changes, not previous surgeries, not recent illness, not recent travel, not sick contacts and not suspicious food intake   Relieved by:  None tried Worsened by:  Nothing Ineffective treatments: Tums. Associated symptoms: anorexia, chills, diarrhea, fatigue, nausea and vomiting   Associated symptoms: no chest pain, no constipation, no cough, no dysuria, no fever, no hematemesis, no hematochezia, no hematuria, no melena and no shortness of breath   Risk factors: has not had multiple surgeries     Past Medical History:  Diagnosis Date  . GERD (gastroesophageal reflux disease)   . Hypertension   . Thyroid disease     There are no problems to display for this patient.   Past Surgical History:   Procedure Laterality Date  . ROTATOR CUFF REPAIR Right        Home Medications    Prior to Admission medications   Medication Sig Start Date End Date Taking? Authorizing Provider  lansoprazole (PREVACID) 15 MG capsule Take 15 mg by mouth daily at 12 noon.   Yes [provider]  levothyroxine (SYNTHROID, LEVOTHROID) 200 MCG tablet Take 200 mcg by mouth daily before breakfast.   Yes [provider]  amoxicillin-clavulanate (AUGMENTIN) 875-125 MG tablet Take one tab PO Q8hr 04/12/20   Lattie Haw, MD  benzonatate (TESSALON) 100 MG capsule Take 1 capsule (100 mg total) by mouth every 8 (eight) hours. 12/29/17   Renne Crigler, PA-C  ondansetron (ZOFRAN ODT) 4 MG disintegrating tablet Take one tab by mouth Q6hr prn nausea 04/12/20   Lattie Haw, MD  oseltamivir (TAMIFLU) 75 MG capsule Take 1 capsule (75 mg total) by mouth every 12 (twelve) hours. 12/29/17   Renne Crigler, PA-C  predniSONE (DELTASONE) 20 MG tablet Take 2 tablets (40 mg total) by mouth daily. 12/29/17   Renne Crigler, PA-C    Family History Family History  Problem Relation Age of Onset  . Cancer Mother   . Healthy Father     Social History Social History   Tobacco Use  . Smoking status: Never Smoker  . Smokeless tobacco: Never Used  Substance Use Topics  . Alcohol use: Yes    Comment: socially  . Drug use:  Not on file     Allergies   Patient has no known allergies.   Review of Systems Review of Systems  Constitutional: Positive for activity change, appetite change, chills and fatigue. Negative for diaphoresis and fever.  HENT: Negative.   Eyes: Negative.   Respiratory: Negative.  Negative for cough and shortness of breath.   Cardiovascular: Negative for chest pain.  Gastrointestinal: Positive for abdominal pain, anorexia, diarrhea, nausea and vomiting. Negative for constipation, hematemesis, hematochezia and melena.  Genitourinary: Negative for dysuria and hematuria.    Musculoskeletal: Negative.   Skin: Negative.   Neurological: Negative.      Physical Exam Triage Vital Signs ED Triage Vitals  Enc Vitals Group     BP 04/12/20 1634 (!) 165/97     Pulse Rate 04/12/20 1634 99     Resp 04/12/20 1634 18     Temp 04/12/20 1634 98 F (36.7 C)     Temp Source 04/12/20 1634 Oral     SpO2 04/12/20 1634 99 %     Weight --      Height --      Head Circumference --      Peak Flow --      Pain Score 04/12/20 1624 6     Pain Loc --      Pain Edu? --      Excl. in [place]? --    No data found.  Updated Vital Signs BP (!) 165/97 (BP Location: Right Arm) Comment: patient was recently vomiting  Pulse 99   Temp 98 F (36.7 C) (Oral)   Resp 18   SpO2 99%   Visual Acuity Right Eye Distance:   Left Eye Distance:   Bilateral Distance:    Right Eye Near:   Left Eye Near:    Bilateral Near:     Physical Exam Vitals and nursing note reviewed.  Constitutional:      General: He is not in acute distress.    Appearance: He is not ill-appearing.  HENT:     Head: Normocephalic.     Right Ear: External ear normal.     Left Ear: External ear normal.     Nose: Nose normal.     Mouth/Throat:     Mouth: Mucous membranes are moist.  Eyes:     Conjunctiva/sclera: Conjunctivae normal.     Pupils: Pupils are equal, round, and reactive to light.  Cardiovascular:     Rate and Rhythm: Normal rate.     Heart sounds: Normal heart sounds.  Pulmonary:     Breath sounds: Normal breath sounds.  Abdominal:     General: Abdomen is protuberant. Bowel sounds are absent.     Palpations: Abdomen is soft. There is no hepatomegaly, splenomegaly or mass.     Tenderness: There is abdominal tenderness in the epigastric area and left lower quadrant. There is no right CVA tenderness, left CVA tenderness, guarding or rebound. Negative signs include McBurney's sign.     Hernia: There is no hernia in the umbilical area or ventral area.       Comments: Tenderness to palpation as  noted on diagram.   Musculoskeletal:     Cervical back: Neck supple.     Right lower leg: No edema.     Left lower leg: No edema.  Lymphadenopathy:     Cervical: No cervical adenopathy.  Skin:    General: Skin is warm and dry.     Findings: No rash.  Neurological:  Mental Status: He is alert.      UC Treatments / Results  Labs (all labs ordered are listed, but only abnormal results are displayed) Labs Reviewed  POCT URINALYSIS DIP (MANUAL ENTRY) - Abnormal; Notable for the following components:      Result Value   Ketones, POC UA trace (5) (*)    Protein Ur, POC =30 (*)    All other components within normal limits  POCT CBC W AUTO DIFF ([facility]):  WBC 17.0; LY 3.3; MO 1.9; GR 94.8; Hgb 15.9; Platelets 201     EKG   Radiology DG Abd 2 Views  Result Date: 04/12/2020 CLINICAL DATA:  Abdominal pain and bloating. Nausea and vomiting and diarrhea. Leukocytosis. History of diverticulitis. EXAM: ABDOMEN - 2 VIEW COMPARISON:  None. FINDINGS: No disproportionately dilated small bowel loops, noting a relatively gasless abdomen. No evidence of pneumatosis or pneumoperitoneum. Clear lung bases. No radiopaque nephrolithiasis. Moderate lumbar spondylosis. IMPRESSION: Relatively gasless abdomen, nonspecific, cannot exclude small bowel obstruction with fluid-filled small bowel loops. CT abdomen/pelvis with oral and IV contrast may be obtained for further evaluation as clinically warranted. Electronically Signed   By: Delbert Phenix M.D.   On: 04/12/2020 18:17    Procedures Procedures (including critical care time)  Medications Ordered in UC Medications  ondansetron (ZOFRAN) injection 4 mg (4 mg Intramuscular Given 04/12/20 1639)    Initial Impression / Assessment and Plan / UC Course  I have reviewed the triage vital signs and the nursing notes.  Pertinent labs & imaging results that were available during my care of the patient were reviewed by me and considered in my medical  decision making (see chart for details).    Administered Zofran ODT 4mg  PO; given Rx for same. Note significant leukocytosis (WBC 17.0).  Suspect recurrent diverticulitis.  Doubt small bowel obstruction. CMP, amylase, lipase pending. Begin Augmentin 875 Q8hr. Return tomorrow for CT abdomen/pelvis w/contrast.   Final Clinical Impressions(s) / UC Diagnoses   Final diagnoses:  Abdominal pain, left lower quadrant  Abdominal pain     Discharge Instructions     Begin clear liquids for about 18 to 24 hours, then may begin a BRAT diet (Bananas, Rice, Applesauce, Toast) when nausea and abdominal pain resolved.  If diarrhea recurs, switch to Pedialyte. May take Tylenol if needed for pain.  If symptoms become significantly worse during the night or over the weekend, proceed to the local emergency room.     ED Prescriptions    Medication Sig Dispense Auth. Provider   ondansetron (ZOFRAN ODT) 4 MG disintegrating tablet Take one tab by mouth Q6hr prn nausea 12 tablet , MD   amoxicillin-clavulanate (AUGMENTIN) 875-125 MG tablet Take one tab PO Q8hr 21 tablet Lattie Haw, MD        Lattie Haw, MD 04/12/20 [phone]

## 2020-04-12 NOTE — ED Triage Notes (Addendum)
Patient presents to Urgent Care with complaints of vomiting and diarrhea since 2 hours ago. Patient reports it started all of a sudden while he was at work. Has eaten a bag of doritos today, had a hamburger last night (grilled).  Patient interrupted this RN during triage so he could vomit, verbal orders received from MD to admin IM zofran when patient is done vomiting in the bathroom.

## 2020-04-12 NOTE — Discharge Instructions (Addendum)
Begin clear liquids for about 18 to 24 hours, then may begin a BRAT diet (Bananas, Rice, Applesauce, Toast) when nausea and abdominal pain resolved.  If diarrhea recurs, switch to Pedialyte. May take Tylenol if needed for pain.  If symptoms become significantly worse during the night or over the weekend, proceed to the local emergency room.

## 2020-04-13 ENCOUNTER — Emergency Department
Admission: EM | Admit: 2020-04-13 | Discharge: 2020-04-13 | Disposition: A | Discharge status: Home / Self Care | Payer: BC Managed Care – PPO | Source: Home / Self Care

## 2020-04-13 ENCOUNTER — Emergency Department (INDEPENDENT_AMBULATORY_CARE_PROVIDER_SITE_OTHER): Payer: BC Managed Care – PPO

## 2020-04-13 DIAGNOSIS — K5732 Diverticulitis of large intestine without perforation or abscess without bleeding: Secondary | ICD-10-CM

## 2020-04-13 LAB — COMPLETE METABOLIC PANEL WITH GFR
AG Ratio: 1.5 (calc) (ref 1.0–2.5)
ALT: 29 U/L (ref 9–46)
AST: 23 U/L (ref 10–35)
Albumin: 4.1 g/dL (ref 3.6–5.1)
Alkaline phosphatase (APISO): 60 U/L (ref 35–144)
BUN: 25 mg/dL (ref 7–25)
CO2: 25 mmol/L (ref 20–32)
Calcium: 9.3 mg/dL (ref 8.6–10.3)
Chloride: 101 mmol/L (ref 98–110)
Creat: 1.07 mg/dL (ref 0.70–1.33)
GFR, Est African American: 88 mL/min/{1.73_m2} (ref >=60)
GFR, Est Non African American: 76 mL/min/{1.73_m2} (ref >=60)
Globulin: 2.7 g/dL (calc) (ref 1.9–3.7)
Glucose, Bld: 127 mg/dL — ABNORMAL HIGH (ref 65–99)
Potassium: 4.5 mmol/L (ref 3.5–5.3)
Sodium: 139 mmol/L (ref 135–146)
Total Bilirubin: 0.8 mg/dL (ref 0.2–1.2)
Total Protein: 6.8 g/dL (ref 6.1–8.1)

## 2020-04-13 LAB — AMYLASE: Amylase: 65 U/L (ref 21–101)

## 2020-04-13 LAB — LIPASE: Lipase: 29 U/L (ref 7–60)

## 2020-04-13 MED ORDER — IOHEXOL 300 MG/ML  SOLN
100.0000 mL | Freq: Once | INTRAMUSCULAR | Status: AC | PRN
  Administered 2020-04-13: 100 mL via INTRAVENOUS

## 2020-04-13 NOTE — Discharge Instructions (Addendum)
Finish Augmentin.  Gradually advance diet as tolerated.

## 2020-08-20 ENCOUNTER — Emergency Department
Admission: EM | Admit: 2020-08-20 | Discharge: 2020-08-20 | Disposition: A | Discharge status: Home / Self Care | Payer: BC Managed Care – PPO | Source: Home / Self Care

## 2020-08-20 ENCOUNTER — Other Ambulatory Visit: Payer: Self-pay

## 2020-08-20 ENCOUNTER — Encounter: Payer: Self-pay | Admitting: Family Medicine

## 2020-08-20 DIAGNOSIS — B085 Enteroviral vesicular pharyngitis: Secondary | ICD-10-CM

## 2020-08-20 MED ORDER — CHLORHEXIDINE GLUCONATE 0.12 % MT SOLN: 15.0000 mL | Freq: Two times a day (BID) | OROMUCOSAL | 0 refills | Status: DC

## 2020-08-20 MED ORDER — FLUCONAZOLE 100 MG PO TABS: 100.0000 mg | ORAL_TABLET | Freq: Every day | ORAL | 0 refills | Status: DC

## 2020-08-20 NOTE — ED Provider Notes (Signed)
[facility]    CSN: [id number] Arrival date & time: 08/20/20  1331      History   Chief Complaint Chief Complaint  Patient presents with  . Oral Swelling    HPI Wayne Gross is a 59 y.o. male.   This is a 59 year old established [place] urgent care patient complaining of oral swelling.  Patient reports inside of mouth seems to have edematous spots including uvula, frenum and inside of lips; no dyspnea; started few days ago; no new medications or foods; does take allergy shots and last one 5 days ago with same strength as previous ones.   The sores in the mouth are all associated with dry cracked lips.  This all began 2 days ago and has gotten worse over the last 24 hours. Had covid 12/20.      Past Medical History:  Diagnosis Date  . GERD (gastroesophageal reflux disease)   . Hypertension   . Thyroid disease     There are no problems to display for this patient.   Past Surgical History:  Procedure Laterality Date  . ROTATOR CUFF REPAIR Right        Home Medications    Prior to Admission medications   Medication Sig Start Date End Date Taking? Authorizing Provider  alfuzosin (UROXATRAL) 10 MG 24 hr tablet Take 10 mg by mouth daily with breakfast.   Yes [provider]  fexofenadine-pseudoephedrine (ALLEGRA-D 24) 180-240 MG 24 hr tablet Take 1 tablet by mouth daily.   Yes [provider]  olmesartan-hydrochlorothiazide (BENICAR HCT) 40-25 MG tablet Take 1 tablet by mouth daily.   Yes [provider]  chlorhexidine (PERIDEX) 0.12 % solution Use as directed 15 mLs in the mouth or throat 2 (two) times daily. 08/20/20   Elvina Sidle, MD  fluconazole (DIFLUCAN) 100 MG tablet Take 1 tablet (100 mg total) by mouth daily. 08/20/20   Elvina Sidle, MD  lansoprazole (PREVACID) 15 MG capsule Take 15 mg by mouth daily at 12 noon.    [provider]  levothyroxine (SYNTHROID, LEVOTHROID) 200 MCG tablet Take 175 mcg  by mouth daily before breakfast.     [provider]  ondansetron (ZOFRAN ODT) 4 MG disintegrating tablet Take one tab by mouth Q6hr prn nausea 04/12/20   Lattie Haw, MD    Family History Family History  Problem Relation Age of Onset  . Cancer Mother   . Healthy Father     Social History Social History   Tobacco Use  . Smoking status: Never Smoker  . Smokeless tobacco: Never Used  Substance Use Topics  . Alcohol use: Yes    Comment: socially  . Drug use: Not on file     Allergies   Patient has no known allergies.   Review of Systems Review of Systems  HENT: Positive for mouth sores.   All other systems reviewed and are negative.    Physical Exam Triage Vital Signs ED Triage Vitals  Enc Vitals Group     BP      Pulse      Resp      Temp      Temp src      SpO2      Weight      Height      Head Circumference      Peak Flow      Pain Score      Pain Loc      Pain Edu?  Excl. in [place]?    No data found.  Updated Vital Signs BP (!) 145/83 (BP Location: Right Arm)   Pulse 84   Temp 98.8 F (37.1 C) (Oral)   Resp 16   SpO2 97%    Physical Exam Vitals and nursing note reviewed.  Constitutional:      Appearance: Normal appearance.  HENT:     Head: Normocephalic.     Mouth/Throat:     Mouth: Mucous membranes are dry.     Pharynx: Posterior oropharyngeal erythema present.  Eyes:     Conjunctiva/sclera: Conjunctivae normal.  Pulmonary:     Effort: Pulmonary effort is normal.  Musculoskeletal:        General: Normal range of motion.     Cervical back: Normal range of motion and neck supple. Tenderness present.  Lymphadenopathy:     Cervical: Cervical adenopathy present.  Skin:    General: Skin is warm and dry.  Neurological:     General: No focal deficit present.     Mental Status: He is alert.  Psychiatric:        Mood and Affect: Mood normal.        UC Treatments / Results  Labs (all labs ordered are listed, but only  abnormal results are displayed) Labs Reviewed - No data to display  EKG   Radiology No results found.  Procedures Procedures (including critical care time)  Medications Ordered in UC Medications - No data to display  Initial Impression / Assessment and Plan / UC Course  I have reviewed the triage vital signs and the nursing notes.  Pertinent labs & imaging results that were available during my care of the patient were reviewed by me and considered in my medical decision making (see chart for details).    Final Clinical Impressions(s) / UC Diagnoses   Final diagnoses:  Acute pharyngitis due to Coxsackie virus   Discharge Instructions   None    ED Prescriptions    Medication Sig Dispense Auth. Provider   chlorhexidine (PERIDEX) 0.12 % solution Use as directed 15 mLs in the mouth or throat 2 (two) times daily. 120 mL Elvina Sidle, MD   fluconazole (DIFLUCAN) 100 MG tablet Take 1 tablet (100 mg total) by mouth daily. 7 tablet Elvina Sidle, MD     I have reviewed the PDMP during this encounter.   Elvina Sidle, MD 08/20/20 [phone]

## 2020-08-20 NOTE — ED Triage Notes (Signed)
Patient reports inside of mouth seems to have edematous spots including uvula, frenum and inside of lips; no dyspnea; started few days ago; no new medications or foods; does take allergy shots and last one 5 days ago with same strength as previous ones.  Had covid 12/20.

## 2021-03-23 DIAGNOSIS — J3089 Other allergic rhinitis: Secondary | ICD-10-CM | POA: Diagnosis not present

## 2021-04-06 DIAGNOSIS — J302 Other seasonal allergic rhinitis: Secondary | ICD-10-CM | POA: Diagnosis not present

## 2021-04-13 DIAGNOSIS — J3089 Other allergic rhinitis: Secondary | ICD-10-CM | POA: Diagnosis not present

## 2021-05-18 DIAGNOSIS — J3089 Other allergic rhinitis: Secondary | ICD-10-CM | POA: Diagnosis not present

## 2021-05-26 DIAGNOSIS — J3089 Other allergic rhinitis: Secondary | ICD-10-CM | POA: Diagnosis not present

## 2021-06-09 DIAGNOSIS — J3089 Other allergic rhinitis: Secondary | ICD-10-CM | POA: Diagnosis not present

## 2021-07-13 DEATH — deceased

## 2021-08-10 DIAGNOSIS — L259 Unspecified contact dermatitis, unspecified cause: Secondary | ICD-10-CM | POA: Diagnosis not present

## 2021-09-01 ENCOUNTER — Emergency Department (INDEPENDENT_AMBULATORY_CARE_PROVIDER_SITE_OTHER)
Admission: EM | Admit: 2021-09-01 | Discharge: 2021-09-01 | Disposition: A | Discharge status: Home / Self Care | Payer: BC Managed Care – PPO | Source: Home / Self Care

## 2021-09-01 ENCOUNTER — Encounter (HOSPITAL_BASED_OUTPATIENT_CLINIC_OR_DEPARTMENT_OTHER): Payer: Self-pay

## 2021-09-01 ENCOUNTER — Other Ambulatory Visit: Payer: Self-pay

## 2021-09-01 ENCOUNTER — Inpatient Hospital Stay (HOSPITAL_BASED_OUTPATIENT_CLINIC_OR_DEPARTMENT_OTHER)
Admission: EM | Admit: 2021-09-01 | Discharge: 2021-09-06 | DRG: 872 | Disposition: A | Discharge status: Home / Self Care | Payer: BC Managed Care – PPO | Attending: Internal Medicine | Admitting: Internal Medicine

## 2021-09-01 ENCOUNTER — Emergency Department (HOSPITAL_BASED_OUTPATIENT_CLINIC_OR_DEPARTMENT_OTHER): Payer: BC Managed Care – PPO

## 2021-09-01 DIAGNOSIS — Z79890 Hormone replacement therapy (postmenopausal): Secondary | ICD-10-CM | POA: Diagnosis not present

## 2021-09-01 DIAGNOSIS — K219 Gastro-esophageal reflux disease without esophagitis: Secondary | ICD-10-CM | POA: Diagnosis not present

## 2021-09-01 DIAGNOSIS — K5790 Diverticulosis of intestine, part unspecified, without perforation or abscess without bleeding: Secondary | ICD-10-CM | POA: Diagnosis not present

## 2021-09-01 DIAGNOSIS — A419 Sepsis, unspecified organism: Secondary | ICD-10-CM | POA: Diagnosis not present

## 2021-09-01 DIAGNOSIS — K429 Umbilical hernia without obstruction or gangrene: Secondary | ICD-10-CM | POA: Diagnosis not present

## 2021-09-01 DIAGNOSIS — R110 Nausea: Secondary | ICD-10-CM | POA: Diagnosis not present

## 2021-09-01 DIAGNOSIS — K5732 Diverticulitis of large intestine without perforation or abscess without bleeding: Secondary | ICD-10-CM | POA: Diagnosis not present

## 2021-09-01 DIAGNOSIS — I10 Essential (primary) hypertension: Secondary | ICD-10-CM | POA: Diagnosis present

## 2021-09-01 DIAGNOSIS — E039 Hypothyroidism, unspecified: Secondary | ICD-10-CM | POA: Diagnosis not present

## 2021-09-01 DIAGNOSIS — Z20822 Contact with and (suspected) exposure to covid-19: Secondary | ICD-10-CM | POA: Diagnosis present

## 2021-09-01 DIAGNOSIS — E079 Disorder of thyroid, unspecified: Secondary | ICD-10-CM | POA: Diagnosis not present

## 2021-09-01 DIAGNOSIS — K6389 Other specified diseases of intestine: Secondary | ICD-10-CM | POA: Diagnosis not present

## 2021-09-01 DIAGNOSIS — K5792 Diverticulitis of intestine, part unspecified, without perforation or abscess without bleeding: Secondary | ICD-10-CM | POA: Diagnosis present

## 2021-09-01 DIAGNOSIS — K5780 Diverticulitis of intestine, part unspecified, with perforation and abscess without bleeding: Secondary | ICD-10-CM | POA: Diagnosis not present

## 2021-09-01 DIAGNOSIS — R109 Unspecified abdominal pain: Secondary | ICD-10-CM | POA: Diagnosis present

## 2021-09-01 DIAGNOSIS — N400 Enlarged prostate without lower urinary tract symptoms: Secondary | ICD-10-CM | POA: Diagnosis present

## 2021-09-01 DIAGNOSIS — K5720 Diverticulitis of large intestine with perforation and abscess without bleeding: Secondary | ICD-10-CM | POA: Diagnosis present

## 2021-09-01 DIAGNOSIS — R6510 Systemic inflammatory response syndrome (SIRS) of non-infectious origin without acute organ dysfunction: Secondary | ICD-10-CM

## 2021-09-01 DIAGNOSIS — Z79899 Other long term (current) drug therapy: Secondary | ICD-10-CM

## 2021-09-01 DIAGNOSIS — R1032 Left lower quadrant pain: Secondary | ICD-10-CM

## 2021-09-01 HISTORY — DX: Diverticulitis of intestine, part unspecified, without perforation or abscess without bleeding: K57.92

## 2021-09-01 LAB — POCT URINALYSIS DIP (MANUAL ENTRY)
Bilirubin, UA: NEGATIVE
Blood, UA: NEGATIVE
Glucose, UA: NEGATIVE mg/dL
Ketones, POC UA: NEGATIVE mg/dL
Leukocytes, UA: NEGATIVE
Nitrite, UA: NEGATIVE
Protein Ur, POC: NEGATIVE mg/dL
Spec Grav, UA: 1.015 (ref 1.010–1.025)
Urobilinogen, UA: 0.2 E.U./dL
pH, UA: 7 (ref 5.0–8.0)

## 2021-09-01 LAB — RESP PANEL BY RT-PCR (FLU A&B, COVID) ARPGX2
Influenza A by PCR: NEGATIVE
Influenza B by PCR: NEGATIVE
SARS Coronavirus 2 by RT PCR: NEGATIVE

## 2021-09-01 LAB — CBC WITH DIFFERENTIAL/PLATELET
Abs Immature Granulocytes: 0.06 10*3/uL (ref 0.00–0.07)
Basophils Absolute: 0.1 10*3/uL (ref 0.0–0.1)
Basophils Relative: 0 %
Eosinophils Absolute: 0.3 10*3/uL (ref 0.0–0.5)
Eosinophils Relative: 2 %
HCT: 43.1 % (ref 39.0–52.0)
Hemoglobin: 14.4 g/dL (ref 13.0–17.0)
Immature Granulocytes: 0 %
Lymphocytes Relative: 9 %
Lymphs Abs: 1.3 10*3/uL (ref 0.7–4.0)
MCH: 27.9 pg (ref 26.0–34.0)
MCHC: 33.4 g/dL (ref 30.0–36.0)
MCV: 83.4 fL (ref 80.0–100.0)
Monocytes Absolute: 0.9 10*3/uL (ref 0.1–1.0)
Monocytes Relative: 7 %
Neutro Abs: 11.1 10*3/uL — ABNORMAL HIGH (ref 1.7–7.7)
Neutrophils Relative %: 82 %
Platelets: 203 10*3/uL (ref 150–400)
RBC: 5.17 MIL/uL (ref 4.22–5.81)
RDW: 14.6 % (ref 11.5–15.5)
WBC: 13.7 10*3/uL — ABNORMAL HIGH (ref 4.0–10.5)
nRBC: 0 % (ref 0.0–0.2)

## 2021-09-01 LAB — COMPREHENSIVE METABOLIC PANEL
ALT: 28 U/L (ref 0–44)
AST: 25 U/L (ref 15–41)
Albumin: 3.9 g/dL (ref 3.5–5.0)
Alkaline Phosphatase: 48 U/L (ref 38–126)
Anion gap: 8 (ref 5–15)
BUN: 11 mg/dL (ref 8–23)
CO2: 29 mmol/L (ref 22–32)
Calcium: 9 mg/dL (ref 8.9–10.3)
Chloride: 99 mmol/L (ref 98–111)
Creatinine, Ser: 1.16 mg/dL (ref 0.61–1.24)
GFR, Estimated: >60 mL/min (ref >60)
Glucose, Bld: 115 mg/dL — ABNORMAL HIGH (ref 70–99)
Potassium: 3.8 mmol/L (ref 3.5–5.1)
Sodium: 136 mmol/L (ref 135–145)
Total Bilirubin: 0.7 mg/dL (ref 0.3–1.2)
Total Protein: 6.9 g/dL (ref 6.5–8.1)

## 2021-09-01 LAB — URINALYSIS, ROUTINE W REFLEX MICROSCOPIC
Bilirubin Urine: NEGATIVE
Glucose, UA: NEGATIVE mg/dL
Hgb urine dipstick: NEGATIVE
Ketones, ur: NEGATIVE mg/dL
Leukocytes,Ua: NEGATIVE
Nitrite: NEGATIVE
Protein, ur: NEGATIVE mg/dL
Specific Gravity, Urine: 1.02 (ref 1.005–1.030)
pH: 7 (ref 5.0–8.0)

## 2021-09-01 LAB — LIPASE, BLOOD: Lipase: 26 U/L (ref 11–51)

## 2021-09-01 MED ORDER — HYDROMORPHONE HCL 1 MG/ML IJ SOLN
1.0000 mg | Freq: Once | INTRAMUSCULAR | Status: AC
  Administered 2021-09-01: 1 mg via INTRAVENOUS
  Filled 2021-09-01: qty 1

## 2021-09-01 MED ORDER — SODIUM CHLORIDE 0.9 % IV BOLUS
1000.0000 mL | Freq: Once | INTRAVENOUS | Status: AC
  Administered 2021-09-01: 1000 mL via INTRAVENOUS

## 2021-09-01 MED ORDER — SODIUM CHLORIDE 0.9 % IV BOLUS
500.0000 mL | Freq: Once | INTRAVENOUS | Status: AC
  Administered 2021-09-01: 500 mL via INTRAVENOUS

## 2021-09-01 MED ORDER — MORPHINE SULFATE (PF) 4 MG/ML IV SOLN
4.0000 mg | Freq: Once | INTRAVENOUS | Status: AC
  Administered 2021-09-01: 4 mg via INTRAVENOUS
  Filled 2021-09-01: qty 1

## 2021-09-01 MED ORDER — PIPERACILLIN-TAZOBACTAM 3.375 G IVPB 30 MIN
3.3750 g | Freq: Once | INTRAVENOUS | Status: AC
  Administered 2021-09-01: 3.375 g via INTRAVENOUS
  Filled 2021-09-01: qty 50

## 2021-09-01 MED ORDER — ONDANSETRON HCL 4 MG/2ML IJ SOLN
4.0000 mg | Freq: Three times a day (TID) | INTRAMUSCULAR | Status: DC | PRN
  Administered 2021-09-01: 4 mg via INTRAVENOUS
  Filled 2021-09-01: qty 2

## 2021-09-01 MED ORDER — ONDANSETRON HCL 4 MG/2ML IJ SOLN
4.0000 mg | Freq: Once | INTRAMUSCULAR | Status: AC
  Administered 2021-09-01: 4 mg via INTRAVENOUS
  Filled 2021-09-01: qty 2

## 2021-09-01 MED ORDER — HYDROMORPHONE HCL 1 MG/ML IJ SOLN
1.0000 mg | INTRAMUSCULAR | Status: DC | PRN
  Administered 2021-09-01 – 2021-09-04 (×17): 1 mg via INTRAVENOUS
  Filled 2021-09-01 (×17): qty 1

## 2021-09-01 MED ORDER — IOHEXOL 300 MG/ML  SOLN
100.0000 mL | Freq: Once | INTRAMUSCULAR | Status: AC | PRN
  Administered 2021-09-01: 100 mL via INTRAVENOUS

## 2021-09-01 NOTE — ED Notes (Signed)
Patient is being discharged from the Urgent Care and sent to the Emergency Department via POV. Per Trevor Iha NP, patient is in need of higher level of care due to LLQ abd pain, needs imaging to r/o diverticulitis or kidney stones. Patient is aware and verbalizes understanding of plan of care.  Vitals:   09/01/21 1840  BP: (!) 159/111  Pulse: (!) 105  Resp: 17  Temp: 98.3 F (36.8 C)  SpO2: 99%

## 2021-09-01 NOTE — ED Triage Notes (Signed)
Pt c/o sharp LT sided pain that started last night. Worsening today. Hx of diverticulitis. Pain 10/10 almost causing nausea.

## 2021-09-01 NOTE — Discharge Instructions (Addendum)
Advised/instructed patient to go to [facility] now for further evaluation imaging and stat blood work.  Patient agreed and verbalized understanding of these instructions and this plan of care this evening.

## 2021-09-01 NOTE — ED Provider Notes (Signed)
[facility] EMERGENCY DEPARTMENT Provider Note   CSN: [id number] Arrival date & time: 09/01/21  1911     History Chief Complaint  Patient presents with   Abdominal Pain    Wayne Gross is a 60 y.o. male.  He is here with a complaint of left-sided abdominal pain has been going on for few days.  Steadily worse.  Now having difficulty standing upright and the bumps on the car ride over made the pain worse.  He is felt nauseous and a little feverish.  Increased fatigue.  Normal bowel movements.  No urinary symptoms.  The history is provided by the patient.  Abdominal Pain Pain location:  LLQ Pain quality: aching   Pain radiates to:  Does not radiate Pain severity:  Severe Onset quality:  Gradual Duration:  3 days Timing:  Constant Progression:  Worsening Chronicity:  Recurrent Context: not trauma   Relieved by:  Nothing Worsened by:  Movement, palpation and position changes Ineffective treatments:  None tried Associated symptoms: fatigue and nausea   Associated symptoms: no chest pain, no chills, no constipation, no cough, no diarrhea, no dysuria, no fever, no shortness of breath, no sore throat and no vomiting       Past Medical History:  Diagnosis Date   Diverticulitis    GERD (gastroesophageal reflux disease)    Hypertension    Thyroid disease     There are no problems to display for this patient.   Past Surgical History:  Procedure Laterality Date   ROTATOR CUFF REPAIR Right        Family History  Problem Relation Age of Onset   Cancer Mother    Healthy Father     Social History   Tobacco Use   Smoking status: Never   Smokeless tobacco: Never  Vaping Use   Vaping Use: Never used  Substance Use Topics   Alcohol use: Yes    Comment: socially   Drug use: Never    Home Medications Prior to Admission medications   Medication Sig Start Date End Date Taking? Authorizing Provider  alfuzosin (UROXATRAL) 10 MG 24 hr tablet Take 10 mg by  mouth daily with breakfast.    [provider]  chlorhexidine (PERIDEX) 0.12 % solution Use as directed 15 mLs in the mouth or throat 2 (two) times daily. 08/20/20   Elvina Sidle, MD  fexofenadine-pseudoephedrine (ALLEGRA-D 24) 180-240 MG 24 hr tablet Take 1 tablet by mouth daily.    [provider]  fluconazole (DIFLUCAN) 100 MG tablet Take 1 tablet (100 mg total) by mouth daily. 08/20/20   Elvina Sidle, MD  lansoprazole (PREVACID) 15 MG capsule Take 15 mg by mouth daily at 12 noon.    [provider]  levothyroxine (SYNTHROID, LEVOTHROID) 200 MCG tablet Take 175 mcg by mouth daily before breakfast.     [provider]  olmesartan-hydrochlorothiazide (BENICAR HCT) 40-25 MG tablet Take 1 tablet by mouth daily.    [provider]  ondansetron (ZOFRAN ODT) 4 MG disintegrating tablet Take one tab by mouth Q6hr prn nausea 04/12/20   Lattie Haw, MD    Allergies    Patient has no known allergies.  Review of Systems   Review of Systems  Constitutional:  Positive for fatigue. Negative for chills and fever.  HENT:  Negative for sore throat.   Eyes:  Negative for visual disturbance.  Respiratory:  Negative for cough and shortness of breath.   Cardiovascular:  Negative for chest pain.  Gastrointestinal:  Positive for abdominal pain and nausea. Negative for constipation, diarrhea and vomiting.  Genitourinary:  Negative for dysuria.  Musculoskeletal:  Negative for neck pain.  Skin:  Negative for rash.  Neurological:  Negative for headaches.   Physical Exam Updated Vital Signs BP (!) 174/102 (BP Location: Right Arm)   Pulse (!) 113   Temp 99.7 F (37.6 C) (Oral)   Resp (!) 22   Ht 6' (1.829 m)   Wt 108 kg   SpO2 100%   BMI 32.28 kg/m   Physical Exam Vitals and nursing note reviewed.  Constitutional:      Appearance: Normal appearance. He is well-developed.  HENT:     Head: Normocephalic and atraumatic.  Eyes:      Conjunctiva/sclera: Conjunctivae normal.  Cardiovascular:     Rate and Rhythm: Normal rate and regular rhythm.     Heart sounds: No murmur heard. Pulmonary:     Effort: Pulmonary effort is normal. No respiratory distress.     Breath sounds: Normal breath sounds.  Abdominal:     General: There is distension.     Palpations: Abdomen is soft.     Tenderness: There is abdominal tenderness in the left lower quadrant. There is no guarding or rebound.  Musculoskeletal:        General: Normal range of motion.     Cervical back: Neck supple.     Right lower leg: No edema.     Left lower leg: No edema.  Skin:    General: Skin is warm and dry.  Neurological:     General: No focal deficit present.     Mental Status: He is alert.    ED Results / Procedures / Treatments   Labs (all labs ordered are listed, but only abnormal results are displayed) Labs Reviewed  CBC WITH DIFFERENTIAL/PLATELET - Abnormal; Notable for the following components:      Result Value   WBC 13.7 (*)    Neutro Abs 11.1 (*)    All other components within normal limits  COMPREHENSIVE METABOLIC PANEL - Abnormal; Notable for the following components:   Glucose, Bld 115 (*)    All other components within normal limits  COMPREHENSIVE METABOLIC PANEL - Abnormal; Notable for the following components:   Glucose, Bld 117 (*)    Calcium 8.5 (*)    Total Protein 6.0 (*)    Albumin 3.4 (*)    Anion gap 4 (*)    All other components within normal limits  CBC WITH DIFFERENTIAL/PLATELET - Abnormal; Notable for the following components:   WBC 11.1 (*)    Hemoglobin 12.8 (*)    Neutro Abs 8.6 (*)    Abs Immature Granulocytes 0.08 (*)    All other components within normal limits  RESP PANEL BY RT-PCR (FLU A&B, COVID) ARPGX2  CULTURE, BLOOD (ROUTINE X 2)  CULTURE, BLOOD (ROUTINE X 2)  LIPASE, BLOOD  URINALYSIS, ROUTINE W REFLEX MICROSCOPIC  MAGNESIUM  MAGNESIUM  PROTIME-INR  LACTIC ACID, PLASMA  LACTIC ACID, PLASMA  HIV  ANTIBODY (ROUTINE TESTING W REFLEX)    EKG None  Radiology CT Abdomen Pelvis W Contrast  Result Date: 09/01/2021 CLINICAL DATA:  Left-sided pain EXAM: CT ABDOMEN AND PELVIS WITH CONTRAST TECHNIQUE: Multidetector CT imaging of the abdomen and pelvis was performed using the standard protocol following bolus administration of intravenous contrast. CONTRAST:  OMNIPAQUE IOHEXOL 300 MG/ML  SOLN COMPARISON:  CT 04/13/2020 FINDINGS: Lower chest: No acute abnormality. Hepatobiliary: No focal liver abnormality  is seen. No gallstones, gallbladder wall thickening, or biliary dilatation. Pancreas: Unremarkable. No pancreatic ductal dilatation or surrounding inflammatory changes. Spleen: Normal in size without focal abnormality. Adrenals/Urinary Tract: Adrenal glands are unremarkable. Kidneys are normal, without renal calculi, focal lesion, or hydronephrosis. Bladder is unremarkable. Stomach/Bowel: The stomach is nonenlarged. No dilated small bowel. Negative appendix. Left colon diverticular disease. Focal wall thickening at the sigmoid colon. Moderate inflammatory changes in the left lower quadrant fat. Vascular/Lymphatic: Nonaneurysmal aorta.  No suspicious nodes. Reproductive: Enlarged prostate Other: Multiple small foci of extraluminal gas within the left lower quadrant and central pelvic fat slightly to the left of midline, series 3, image 64, consistent with perforation. Musculoskeletal: No acute osseous abnormality. Small fat containing umbilical hernia with mild soft tissue stranding within and around the hernia. IMPRESSION: 1. Left lower quadrant inflammatory process. Focally thickened sigmoid colon with diverticula present, findings most likely due to acute diverticulitis. Multiple foci of extraluminal gas in the left lower quadrant and central pelvis consistent with perforation. No abscess 2. Small fat containing umbilical hernia with inflammatory changes around the hernia and within the hernia sac,  correlate for symptoms of incarcerated fat hernia Critical Value/emergent results were called by telephone at the time of interpretation on 09/01/2021 at 9:43 pm to provider [facility] , who verbally acknowledged these results. Electronically Signed   By: Jasmine Pang M.D.   On: 09/01/2021 21:43    Procedures Procedures   Medications Ordered in ED Medications  morphine 4 MG/ML injection 4 mg (has no administration in time range)  ondansetron (ZOFRAN) injection 4 mg (has no administration in time range)  sodium chloride 0.9 % bolus 500 mL (has no administration in time range)    ED Course  I have reviewed the triage vital signs and the nursing notes.  Pertinent labs & imaging results that were available during my care of the patient were reviewed by me and considered in my medical decision making (see chart for details).  Clinical Course as of 09/02/21 1029  Fri Sep 01, 2021  2233 Discussed with Dr. Derrell Lolling general surgery.  He asked that the patient had a medical admission and surgery will follow as a Research scientist (medical). [MB]    Clinical Course User Index [MB] Terrilee Files, MD   MDM Rules/Calculators/A&P                          This patient complains of progressive left lower quadrant abdominal pain; this involves an extensive number of treatment Options and is a complaint that carries with it a high risk of complications and Morbidity. The differential includes diverticulitis, perforation, colitis, obstruction renal colic vascular  I ordered, reviewed and interpreted labs, which included CBC with elevated white count, normal hemoglobin, chemistries and LFTs unremarkable, COVID and flu testing negative, urinalysis finds of infection I ordered medication IV pain medication antibiotics and fluids I ordered imaging studies which included CT abdomen pelvis and I independently    visualized and interpreted imaging which showed acute diverticulitis with microperforation no  abscess Additional history obtained from patient's wife Previous records obtained and reviewed in epic including prior ED visit for diverticulitis I consulted general surgery Dr. Derrell Lolling and discussed lab and imaging findings  Critical Interventions: None  After the interventions stated above, I reevaluated the patient and found patient still to be symptomatic.  Will need admission to the hospital for continued IV antibiotics.  His care is signed out to oncoming provider Dr.  Long to discuss with hospitalist regarding admission.   Final Clinical Impression(s) / ED Diagnoses Final diagnoses:  Acute diverticulitis  SIRS (systemic inflammatory response syndrome) (HCC)    Rx / DC Orders ED Discharge Orders     None        Terrilee Files, MD 09/02/21 [phone]

## 2021-09-01 NOTE — ED Provider Notes (Signed)
[facility]    CSN: [id number] Arrival date & time: 09/01/21  1739      History   Chief Complaint Chief Complaint  Patient presents with   Abdominal Pain    LLQ    HPI Wayne Gross is a 60 y.o. male.   HPI  Past Medical History:  Diagnosis Date   GERD (gastroesophageal reflux disease)    Hypertension    Thyroid disease     There are no problems to display for this patient.   Past Surgical History:  Procedure Laterality Date   ROTATOR CUFF REPAIR Right        Home Medications    Prior to Admission medications   Medication Sig Start Date End Date Taking? Authorizing Provider  alfuzosin (UROXATRAL) 10 MG 24 hr tablet Take 10 mg by mouth daily with breakfast.    [provider]  chlorhexidine (PERIDEX) 0.12 % solution Use as directed 15 mLs in the mouth or throat 2 (two) times daily. 08/20/20   Elvina Sidle, MD  fexofenadine-pseudoephedrine (ALLEGRA-D 24) 180-240 MG 24 hr tablet Take 1 tablet by mouth daily.    [provider]  fluconazole (DIFLUCAN) 100 MG tablet Take 1 tablet (100 mg total) by mouth daily. 08/20/20   Elvina Sidle, MD  lansoprazole (PREVACID) 15 MG capsule Take 15 mg by mouth daily at 12 noon.    [provider]  levothyroxine (SYNTHROID, LEVOTHROID) 200 MCG tablet Take 175 mcg by mouth daily before breakfast.     [provider]  olmesartan-hydrochlorothiazide (BENICAR HCT) 40-25 MG tablet Take 1 tablet by mouth daily.    [provider]  ondansetron (ZOFRAN ODT) 4 MG disintegrating tablet Take one tab by mouth Q6hr prn nausea 04/12/20   Lattie Haw, MD    Family History Family History  Problem Relation Age of Onset   Cancer Mother    Healthy Father     Social History Social History   Tobacco Use   Smoking status: Never   Smokeless tobacco: Never  Substance Use Topics   Alcohol use: Yes    Comment: socially     Allergies   Patient has no known  allergies.   Review of Systems Review of Systems  Gastrointestinal:  Positive for abdominal pain.  All other systems reviewed and are negative.   Physical Exam Triage Vital Signs ED Triage Vitals [09/01/21 1818]  Enc Vitals Group     BP      Pulse      Resp      Temp      Temp src      SpO2      Weight      Height      Head Circumference      Peak Flow      Pain Score 10     Pain Loc      Pain Edu?      Excl. in GC?    No data found.  Updated Vital Signs There were no vitals taken for this visit.     Physical Exam Vitals and nursing note reviewed.  Constitutional:      General: He is not in acute distress.    Appearance: He is obese. He is ill-appearing.  HENT:     Head: Normocephalic and atraumatic.  Cardiovascular:     Rate and Rhythm: Normal rate and regular rhythm.     Pulses: Normal pulses.     Heart sounds: Normal heart sounds.  Pulmonary:     Effort: Pulmonary effort is normal.     Breath sounds: Normal breath sounds.  Abdominal:     General: There is distension.     Palpations: There is no mass.     Tenderness: There is abdominal tenderness. There is rebound. There is no right CVA tenderness, left CVA tenderness or guarding.     Hernia: No hernia is present.     Comments: Hypoactive bowel sounds x4 quads, severely TTP over left lower quadrant, no hepatosplenomegaly  Musculoskeletal:        General: Normal range of motion.     Cervical back: Normal range of motion and neck supple.  Skin:    General: Skin is warm and dry.  Neurological:     General: No focal deficit present.     Mental Status: He is alert and oriented to person, place, and time. Mental status is at baseline.     UC Treatments / Results  Labs (all labs ordered are listed, but only abnormal results are displayed) Labs Reviewed  POCT URINALYSIS DIP (MANUAL ENTRY)    EKG   Radiology No results found.  Procedures Procedures (including critical care time)  Medications  Ordered in UC Medications - No data to display  Initial Impression / Assessment and Plan / UC Course  I have reviewed the triage vital signs and the nursing notes.  Pertinent labs & imaging results that were available during my care of the patient were reviewed by me and considered in my medical decision making (see chart for details).     MDM: 1.  Left lower quadrant abdominal pain-Advised/instructed patient to go to [facility] now for further evaluation imaging and stat blood work.  Patient agreed and verbalized understanding of these instructions and this plan of care this evening.  Patient discharged, hemodynamically stable. Final Clinical Impressions(s) / UC Diagnoses   Final diagnoses:  Abdominal pain, left lower quadrant     Discharge Instructions      Advised/instructed patient to go to [facility] now for further evaluation imaging and stat blood work.  Patient agreed and verbalized understanding of these instructions and this plan of care this evening.      ED Prescriptions   None    PDMP not reviewed this encounter.   Trevor Iha, FNP 09/01/21 [phone]

## 2021-09-01 NOTE — ED Notes (Signed)
Star[place]-pts wife [phone]

## 2021-09-01 NOTE — ED Triage Notes (Signed)
Pt c/o LLQ pain started last night-recent increase in urinary retention-also states pain feels similar location for diverticulitis/pain worse-seen/sent from [facility]-steady gait

## 2021-09-02 ENCOUNTER — Encounter (HOSPITAL_COMMUNITY): Payer: Self-pay | Admitting: Family Medicine

## 2021-09-02 DIAGNOSIS — N400 Enlarged prostate without lower urinary tract symptoms: Secondary | ICD-10-CM | POA: Diagnosis present

## 2021-09-02 DIAGNOSIS — Z79899 Other long term (current) drug therapy: Secondary | ICD-10-CM | POA: Diagnosis not present

## 2021-09-02 DIAGNOSIS — K219 Gastro-esophageal reflux disease without esophagitis: Secondary | ICD-10-CM | POA: Diagnosis present

## 2021-09-02 DIAGNOSIS — E039 Hypothyroidism, unspecified: Secondary | ICD-10-CM | POA: Diagnosis present

## 2021-09-02 DIAGNOSIS — R1032 Left lower quadrant pain: Secondary | ICD-10-CM

## 2021-09-02 DIAGNOSIS — Z79890 Hormone replacement therapy (postmenopausal): Secondary | ICD-10-CM | POA: Diagnosis not present

## 2021-09-02 DIAGNOSIS — K5792 Diverticulitis of intestine, part unspecified, without perforation or abscess without bleeding: Secondary | ICD-10-CM | POA: Diagnosis present

## 2021-09-02 DIAGNOSIS — R110 Nausea: Secondary | ICD-10-CM | POA: Diagnosis present

## 2021-09-02 DIAGNOSIS — E079 Disorder of thyroid, unspecified: Secondary | ICD-10-CM | POA: Diagnosis present

## 2021-09-02 DIAGNOSIS — K5720 Diverticulitis of large intestine with perforation and abscess without bleeding: Secondary | ICD-10-CM | POA: Diagnosis present

## 2021-09-02 DIAGNOSIS — K429 Umbilical hernia without obstruction or gangrene: Secondary | ICD-10-CM | POA: Diagnosis present

## 2021-09-02 DIAGNOSIS — A419 Sepsis, unspecified organism: Secondary | ICD-10-CM | POA: Diagnosis present

## 2021-09-02 DIAGNOSIS — I10 Essential (primary) hypertension: Secondary | ICD-10-CM | POA: Diagnosis present

## 2021-09-02 DIAGNOSIS — R109 Unspecified abdominal pain: Secondary | ICD-10-CM | POA: Diagnosis present

## 2021-09-02 DIAGNOSIS — K5780 Diverticulitis of intestine, part unspecified, with perforation and abscess without bleeding: Secondary | ICD-10-CM | POA: Diagnosis not present

## 2021-09-02 DIAGNOSIS — Z20822 Contact with and (suspected) exposure to covid-19: Secondary | ICD-10-CM | POA: Diagnosis present

## 2021-09-02 DIAGNOSIS — K5732 Diverticulitis of large intestine without perforation or abscess without bleeding: Secondary | ICD-10-CM | POA: Diagnosis present

## 2021-09-02 LAB — CBC WITH DIFFERENTIAL/PLATELET
Abs Immature Granulocytes: 0.08 10*3/uL — ABNORMAL HIGH (ref 0.00–0.07)
Basophils Absolute: 0 10*3/uL (ref 0.0–0.1)
Basophils Relative: 0 %
Eosinophils Absolute: 0.2 10*3/uL (ref 0.0–0.5)
Eosinophils Relative: 2 %
HCT: 39.7 % (ref 39.0–52.0)
Hemoglobin: 12.8 g/dL — ABNORMAL LOW (ref 13.0–17.0)
Immature Granulocytes: 1 %
Lymphocytes Relative: 12 %
Lymphs Abs: 1.3 10*3/uL (ref 0.7–4.0)
MCH: 27.8 pg (ref 26.0–34.0)
MCHC: 32.2 g/dL (ref 30.0–36.0)
MCV: 86.3 fL (ref 80.0–100.0)
Monocytes Absolute: 0.9 10*3/uL (ref 0.1–1.0)
Monocytes Relative: 8 %
Neutro Abs: 8.6 10*3/uL — ABNORMAL HIGH (ref 1.7–7.7)
Neutrophils Relative %: 77 %
Platelets: 173 10*3/uL (ref 150–400)
RBC: 4.6 MIL/uL (ref 4.22–5.81)
RDW: 15 % (ref 11.5–15.5)
WBC: 11.1 10*3/uL — ABNORMAL HIGH (ref 4.0–10.5)
nRBC: 0 % (ref 0.0–0.2)

## 2021-09-02 LAB — LACTIC ACID, PLASMA
Lactic Acid, Venous: 0.9 mmol/L (ref 0.5–1.9)
Lactic Acid, Venous: 1.1 mmol/L (ref 0.5–1.9)

## 2021-09-02 LAB — PROTIME-INR
INR: 1.1 (ref 0.8–1.2)
Prothrombin Time: 13.8 seconds (ref 11.4–15.2)

## 2021-09-02 LAB — MAGNESIUM
Magnesium: 1.8 mg/dL (ref 1.7–2.4)
Magnesium: 1.8 mg/dL (ref 1.7–2.4)

## 2021-09-02 LAB — COMPREHENSIVE METABOLIC PANEL
ALT: 22 U/L (ref 0–44)
AST: 18 U/L (ref 15–41)
Albumin: 3.4 g/dL — ABNORMAL LOW (ref 3.5–5.0)
Alkaline Phosphatase: 39 U/L (ref 38–126)
Anion gap: 4 — ABNORMAL LOW (ref 5–15)
BUN: 12 mg/dL (ref 8–23)
CO2: 28 mmol/L (ref 22–32)
Calcium: 8.5 mg/dL — ABNORMAL LOW (ref 8.9–10.3)
Chloride: 105 mmol/L (ref 98–111)
Creatinine, Ser: 1.24 mg/dL (ref 0.61–1.24)
GFR, Estimated: >60 mL/min (ref >60)
Glucose, Bld: 117 mg/dL — ABNORMAL HIGH (ref 70–99)
Potassium: 3.5 mmol/L (ref 3.5–5.1)
Sodium: 137 mmol/L (ref 135–145)
Total Bilirubin: 1.2 mg/dL (ref 0.3–1.2)
Total Protein: 6 g/dL — ABNORMAL LOW (ref 6.5–8.1)

## 2021-09-02 LAB — HIV ANTIBODY (ROUTINE TESTING W REFLEX): HIV Screen 4th Generation wRfx: NONREACTIVE

## 2021-09-02 MED ORDER — PANTOPRAZOLE SODIUM 40 MG IV SOLR
40.0000 mg | INTRAVENOUS | Status: DC
  Administered 2021-09-02 – 2021-09-04 (×3): 40 mg via INTRAVENOUS
  Filled 2021-09-02 (×3): qty 40

## 2021-09-02 MED ORDER — PIPERACILLIN-TAZOBACTAM 3.375 G IVPB
3.3750 g | Freq: Three times a day (TID) | INTRAVENOUS | Status: DC
  Administered 2021-09-02 – 2021-09-06 (×13): 3.375 g via INTRAVENOUS
  Filled 2021-09-02 (×15): qty 50

## 2021-09-02 MED ORDER — HYDROCHLOROTHIAZIDE 25 MG PO TABS
25.0000 mg | ORAL_TABLET | Freq: Every day | ORAL | Status: DC
  Administered 2021-09-02 – 2021-09-06 (×5): 25 mg via ORAL
  Filled 2021-09-02 (×5): qty 1

## 2021-09-02 MED ORDER — ACETAMINOPHEN 650 MG RE SUPP: 650.0000 mg | Freq: Four times a day (QID) | RECTAL | Status: DC | PRN

## 2021-09-02 MED ORDER — SODIUM CHLORIDE 0.9 % IV SOLN
12.5000 mg | Freq: Four times a day (QID) | INTRAVENOUS | Status: DC | PRN
  Administered 2021-09-02 – 2021-09-03 (×3): 12.5 mg via INTRAVENOUS
  Filled 2021-09-02 (×3): qty 12.5

## 2021-09-02 MED ORDER — ACETAMINOPHEN 325 MG PO TABS
650.0000 mg | ORAL_TABLET | Freq: Four times a day (QID) | ORAL | Status: DC | PRN
  Administered 2021-09-03 – 2021-09-06 (×10): 650 mg via ORAL
  Filled 2021-09-02 (×10): qty 2

## 2021-09-02 MED ORDER — OLMESARTAN MEDOXOMIL-HCTZ 40-25 MG PO TABS: 1.0000 | ORAL_TABLET | Freq: Every day | ORAL | Status: DC

## 2021-09-02 MED ORDER — LEVOTHYROXINE SODIUM 100 MCG/5ML IV SOLN: 87.5000 ug | Freq: Every day | INTRAVENOUS | Status: DC

## 2021-09-02 MED ORDER — IRBESARTAN 300 MG PO TABS
300.0000 mg | ORAL_TABLET | Freq: Every day | ORAL | Status: DC
  Administered 2021-09-02 – 2021-09-06 (×5): 300 mg via ORAL
  Filled 2021-09-02 (×5): qty 1

## 2021-09-02 MED ORDER — ONDANSETRON HCL 4 MG/2ML IJ SOLN
4.0000 mg | Freq: Four times a day (QID) | INTRAMUSCULAR | Status: DC | PRN
  Administered 2021-09-02 – 2021-09-03 (×3): 4 mg via INTRAVENOUS
  Filled 2021-09-02 (×4): qty 2

## 2021-09-02 MED ORDER — LACTATED RINGERS IV SOLN: INTRAVENOUS | Status: DC

## 2021-09-02 NOTE — Progress Notes (Signed)
Pharmacy Antibiotic Note  Wayne Gross is a 60 y.o. male admitted on 09/01/2021 with Acute diverticulitis with evidence of microperforation.  Pharmacy has been consulted for zosyn dosing.  Plan: Zosyn 3.375g IV q8h (4 hour infusion). Pharmacy will sign off and follow peripherally  Height: 6' (182.9 cm) Weight: 108 kg (238 lb) IBW/kg (Calculated) : 77.6  Temp (24hrs), Avg:98.9 F (37.2 C), Min:98.3 F (36.8 C), Max:99.7 F (37.6 C)  Recent Labs  Lab 09/01/21 1956  WBC 13.7*  CREATININE 1.16    Estimated Creatinine Clearance: 84.9 mL/min (by C-G formula based on SCr of 1.16 mg/dL).    No Known Allergies   Thank you for allowing pharmacy to be a part of this patient's care.  Arley Phenix RPh 09/02/2021, 2:36 AM

## 2021-09-02 NOTE — Progress Notes (Signed)
PROGRESS NOTE    Wayne Gross  ONG:[id number] DOB: Aug 25, 1961 DOA: 09/01/2021 PCP: Jim Like, MD    Brief Narrative:  [phone] with hx HTN, GERD presented with abd pain, found to have acute diverticulitis with microperforation. General Surgery consulted  Assessment & Plan:   Principal Problem:   Acute diverticulitis Active Problems:   Abdominal pain   Nausea   Sepsis (HCC)   Thyroid disease   GERD (gastroesophageal reflux disease)   Hypertension   BPH (benign prostatic hyperplasia)    #) sepsis due to acute diverticulitis with microperforation:  -Currently NPO continued on zosyn -General Surgery following recs for cont regimen -OK for sips with meds per gen surg -repeat cbc in AM   #) Essential Hypertension:  -BP stable at this time -Will resume home bp meds, sips with meds per surgery recs   #) Acquired hypothyroidism:  -Continued thyroid replacement   #) GERD: -cont on IV protonix   #) Benign prostatic hyperplasia:  -alfuzosin on hold at time of presentation  DVT prophylaxis: SCD's Code Status: Full Family Communication: Pt in room, family not at bedside  Status is: Inpatient  Remains inpatient appropriate because: severity of illness   Consultants:  General Surgery  Procedures:    Antimicrobials: Anti-infectives (From admission, onward)    Start     Dose/Rate Route Frequency Ordered Stop   09/02/21 0600  piperacillin-tazobactam (ZOSYN) IVPB 3.375 g        3.375 g 12.5 mL/hr over 240 Minutes Intravenous Every 8 hours 09/02/21 0148     09/01/21 2145  piperacillin-tazobactam (ZOSYN) IVPB 3.375 g        3.375 g 100 mL/hr over 30 Minutes Intravenous  Once 09/01/21 2143 09/01/21 2257       Subjective: Still having lower quadrant pain  Objective: Vitals:   09/02/21 0154 09/02/21 0600 09/02/21 1054 09/02/21 1535  BP: 139/76 134/83 122/83 127/89  Pulse: 98 99 84 (!) 101  Resp: 14 16 18 17   Temp: 98.3 F (36.8 C) 98.1 F (36.7 C) 98.7  F (37.1 C) 99.2 F (37.3 C)  TempSrc: Oral Oral Oral Oral  SpO2: 95% 95% 96% 94%  Weight:      Height:        Intake/Output Summary (Last 24 hours) at 09/02/2021 1746 Last data filed at 09/02/2021 1521 Gross per 24 hour  Intake 621.84 ml  Output --  Net 621.84 ml   Filed Weights   09/01/21 1918  Weight: 108 kg    Examination: General exam: Awake, laying in bed, in nad Respiratory system: Normal respiratory effort, no wheezing Cardiovascular system: regular rate, s1, s2 Gastrointestinal system: Soft, nondistended, lower quadrant tenderness Central nervous system: CN2-12 grossly intact, strength intact Extremities: Perfused, no clubbing Skin: Normal skin turgor, no notable skin lesions seen Psychiatry: Mood normal // no visual hallucinations   Data Reviewed: I have personally reviewed following labs and imaging studies  CBC: Recent Labs  Lab 09/01/21 1956 09/02/21 0644  WBC 13.7* 11.1*  NEUTROABS 11.1* 8.6*  HGB 14.4 12.8*  HCT 43.1 39.7  MCV 83.4 86.3  PLT 203 173   Basic Metabolic Panel: Recent Labs  Lab 09/01/21 1956 09/02/21 0644  NA 136 137  K 3.8 3.5  CL 99 105  CO2 29 28  GLUCOSE 115* 117*  BUN 11 12  CREATININE 1.16 1.24  CALCIUM 9.0 8.5*  MG  --  1.8  1.8   GFR: Estimated Creatinine Clearance: 79.5 mL/min (by C-G formula based on  SCr of 1.24 mg/dL). Liver Function Tests: Recent Labs  Lab 09/01/21 1956 09/02/21 0644  AST 25 18  ALT 28 22  ALKPHOS 48 39  BILITOT 0.7 1.2  PROT 6.9 6.0*  ALBUMIN 3.9 3.4*   Recent Labs  Lab 09/01/21 1956  LIPASE 26   No results for input(s): AMMONIA in the last 168 hours. Coagulation Profile: Recent Labs  Lab 09/02/21 0644  INR 1.1   Cardiac Enzymes: No results for input(s): CKTOTAL, CKMB, CKMBINDEX, TROPONINI in the last 168 hours. BNP (last 3 results) No results for input(s): PROBNP in the last 8760 hours. HbA1C: No results for input(s): HGBA1C in the last 72 hours. CBG: No results for  input(s): GLUCAP in the last 168 hours. Lipid Profile: No results for input(s): CHOL, HDL, LDLCALC, TRIG, CHOLHDL, LDLDIRECT in the last 72 hours. Thyroid Function Tests: No results for input(s): TSH, T4TOTAL, FREET4, T3FREE, THYROIDAB in the last 72 hours. Anemia Panel: No results for input(s): VITAMINB12, FOLATE, FERRITIN, TIBC, IRON, RETICCTPCT in the last 72 hours. Sepsis Labs: Recent Labs  Lab 09/02/21 0644 09/02/21 0825  LATICACIDVEN 0.9 1.1    Recent Results (from the past 240 hour(s))  Resp Panel by RT-PCR (Flu A&B, Covid) Nasopharyngeal Swab     Status: None   Collection Time: 09/01/21  9:54 PM   Specimen: Nasopharyngeal Swab; Nasopharyngeal(NP) swabs in vial transport medium  Result Value Ref Range Status   SARS Coronavirus 2 by RT PCR NEGATIVE NEGATIVE Final    Comment: (NOTE) SARS-CoV-2 target nucleic acids are NOT DETECTED.  The SARS-CoV-2 RNA is generally detectable in upper respiratory specimens during the acute phase of infection. The lowest concentration of SARS-CoV-2 viral copies this assay can detect is 138 copies/mL. A negative result does not preclude SARS-Cov-2 infection and should not be used as the sole basis for treatment or other patient management decisions. A negative result may occur with  improper specimen collection/handling, submission of specimen other than nasopharyngeal swab, presence of viral mutation(s) within the areas targeted by this assay, and inadequate number of viral copies(<138 copies/mL). A negative result must be combined with clinical observations, patient history, and epidemiological information. The expected result is Negative.  Fact Sheet for Patients:  BloggerCourse.com  Fact Sheet for Healthcare Providers:  SeriousBroker.it  This test is no t yet approved or cleared by the [place] FDA and  has been authorized for detection and/or diagnosis of SARS-CoV-2 by FDA under  an Emergency Use Authorization (EUA). This EUA will remain  in effect (meaning this test can be used) for the duration of the COVID-19 declaration under Section 564(b)(1) of the Act, 21 U.S.C.section 360bbb-3(b)(1), unless the authorization is terminated  or revoked sooner.       Influenza A by PCR NEGATIVE NEGATIVE Final   Influenza B by PCR NEGATIVE NEGATIVE Final    Comment: (NOTE) The Xpert Xpress SARS-CoV-2/FLU/RSV plus assay is intended as an aid in the diagnosis of influenza from Nasopharyngeal swab specimens and should not be used as a sole basis for treatment. Nasal washings and aspirates are unacceptable for Xpert Xpress SARS-CoV-2/FLU/RSV testing.  Fact Sheet for Patients: BloggerCourse.com  Fact Sheet for Healthcare Providers: SeriousBroker.it  This test is not yet approved or cleared by the [place] FDA and has been authorized for detection and/or diagnosis of SARS-CoV-2 by FDA under an Emergency Use Authorization (EUA). This EUA will remain in effect (meaning this test can be used) for the duration of the COVID-19 declaration under Section 564(b)(1) of  the Act, 21 U.S.C. section 360bbb-3(b)(1), unless the authorization is terminated or revoked.  Performed at [facility], [address] [place], [place] [address]      Radiology Studies: CT Abdomen Pelvis W Contrast  Result Date: 09/01/2021 CLINICAL DATA:  Left-sided pain EXAM: CT ABDOMEN AND PELVIS WITH CONTRAST TECHNIQUE: Multidetector CT imaging of the abdomen and pelvis was performed using the standard protocol following bolus administration of intravenous contrast. CONTRAST:  OMNIPAQUE IOHEXOL 300 MG/ML  SOLN COMPARISON:  CT 04/13/2020 FINDINGS: Lower chest: No acute abnormality. Hepatobiliary: No focal liver abnormality is seen. No gallstones, gallbladder wall thickening, or biliary dilatation. Pancreas: Unremarkable. No pancreatic  ductal dilatation or surrounding inflammatory changes. Spleen: Normal in size without focal abnormality. Adrenals/Urinary Tract: Adrenal glands are unremarkable. Kidneys are normal, without renal calculi, focal lesion, or hydronephrosis. Bladder is unremarkable. Stomach/Bowel: The stomach is nonenlarged. No dilated small bowel. Negative appendix. Left colon diverticular disease. Focal wall thickening at the sigmoid colon. Moderate inflammatory changes in the left lower quadrant fat. Vascular/Lymphatic: Nonaneurysmal aorta.  No suspicious nodes. Reproductive: Enlarged prostate Other: Multiple small foci of extraluminal gas within the left lower quadrant and central pelvic fat slightly to the left of midline, series 3, image 64, consistent with perforation. Musculoskeletal: No acute osseous abnormality. Small fat containing umbilical hernia with mild soft tissue stranding within and around the hernia. IMPRESSION: 1. Left lower quadrant inflammatory process. Focally thickened sigmoid colon with diverticula present, findings most likely due to acute diverticulitis. Multiple foci of extraluminal gas in the left lower quadrant and central pelvis consistent with perforation. No abscess 2. Small fat containing umbilical hernia with inflammatory changes around the hernia and within the hernia sac, correlate for symptoms of incarcerated fat hernia Critical Value/emergent results were called by telephone at the time of interpretation on 09/01/2021 at 9:43 pm to provider [facility] , who verbally acknowledged these results. Electronically Signed   By: Jasmine Pang M.D.   On: 09/01/2021 21:43    Scheduled Meds:  [START ON 09/05/2021] levothyroxine  87.5 mcg Intravenous Daily   pantoprazole (PROTONIX) IV  40 mg Intravenous Q24H   Continuous Infusions:  lactated ringers 75 mL/hr at 09/02/21 1746   piperacillin-tazobactam (ZOSYN)  IV 12.5 mL/hr at 09/02/21 1521   promethazine (PHENERGAN) injection (IM or IVPB) Stopped  (09/02/21 1304)     LOS: 0 days   Rickey Barbara, MD [facility] Pager On Amion  If 7PM-7AM, please contact night-coverage 09/02/2021, 5:46 PM

## 2021-09-02 NOTE — H&P (Signed)
History and Physical    PLEASE NOTE THAT DRAGON DICTATION SOFTWARE WAS USED IN THE CONSTRUCTION OF THIS NOTE.   HELIX LAFONTAINE HUT:[id number] DOB: 06/13/1961 DOA: 09/01/2021  PCP: Redmond Pulling, MD Patient coming from: home   I have personally briefly reviewed patient's old medical records in [place]  Chief Complaint: Abdominal pain  HPI: Wayne Gross is a 60 y.o. male with medical history significant for essential hypertension, acquired hypothyroidism, GERD, BPH, who is admitted to [facility] on 09/01/2021 by way of transfer from [place] emergency department with acute diverticulitis with microperforation after presenting from home to the latter facility complaining of abdominal pain.   The patient presented to [facility] emergency department on 09/01/2021 complaining of 2 to 3 days of progressive lower quadrant abdominal discomfort, which she describes as sharp and nonradiating in nature.  While this discomfort was initially intermittent, he reports that it is progressed in frequency, now noting the pain to be constant over the course of the last 1 day.  Reports exacerbation of the associated intensity of the discomfort with palpation over the left portion of the abdomen.  He notes associated subjective fever in the absence of objective fever and also denies any associated chills, full body rigors, or generalized myalgias.  He notes associated intermittent nausea in the absence of any vomiting.  No recent diarrhea, melena, or hematochezia.  He also denies any acute dysuria, gross hematuria, or change in urinary urgency/frequency.  In the setting of the aforementioned recent intermittent nausea, he reports decline in oral intake of food and fluid over the last few days.  Denies any recent headache, neck stiffness, rhinitis, rhinorrhea, sore throat, shortness of breath, wheezing, cough, or rash.  No recent travel or known COVID-19 exposures.   Denies any recent chest pain, diaphoresis, palpitations, dizziness, presyncope, or syncope.  Not on any blood thinners as an outpatient, including aspirin.    ED Course:  Vital signs in the ED were notable for the following: Temperature max 99.7, initial heart rate 113, which decreased to 94 following interval administration of IV fluids, as further detailed below; blood pressure 107/67 -131/81; respiratory rate 18-22, oxygen saturation 94 to 100% on room air.  Labs were notable for the following: CMP was notable for the following troponin 29, creatinine 1.16, liver enzymes be within normal limits.  Lipase 26.  CBC notable for blood cell count 13,700 with 82% neutrophils.  Urinalysis notable for normal blood cells, leukocyte Estrace negative and specific gravity 1.020.  Screening COVID-19/influenza PCR checked in the ED today and found to be negative.  Imaging and additional notable ED work-up: CT abdomen/pelvis with contrast showed left lower quadrant inflammatory process associated with focally thickened sigmoid colon with diverticula present, most consistent with acute diverticulitis, also showing multiple foci of extraluminal gas in the left lower quadrant and central pelvis consistent with microperforation in the absence of evidence of bowel abscess or obstruction.  EDP discussed the patient's case and imaging with the on-call general surgeon, Dr. Rosendo Gros, who recommended admission to the hospital service for plan for general surgery consultation, and recommended interval n.p.o., IV fluids, IV antibiotics with anaerobic coverage, as well as symptomatic management.  While in the ED, the following were administered: Dilaudid 1 mg IV x2 doses, morphine 4 mg IV x1, Zofran 4 mg IV x2, Zosyn, normal saline x1.5 L bolus.  Subsequently, the patient was transferred to [facility] to the med telemetry unit for further  evaluation and management of presenting acute diverticulitis with suspected  microperforation.     Review of Systems: As per HPI otherwise 10 point review of systems negative.   Past Medical History:  Diagnosis Date   Diverticulitis    GERD (gastroesophageal reflux disease)    Hypertension    Thyroid disease     Past Surgical History:  Procedure Laterality Date   ROTATOR CUFF REPAIR Right     Social History:  reports that he has never smoked. He has never used smokeless tobacco. He reports current alcohol use. He reports that he does not use drugs.   No Known Allergies  Family History  Problem Relation Age of Onset   Cancer Mother    Healthy Father     Family history reviewed and not pertinent    Prior to Admission medications   Medication Sig Start Date End Date Taking? Authorizing Provider  alfuzosin (UROXATRAL) 10 MG 24 hr tablet Take 10 mg by mouth daily with breakfast.    [provider]  chlorhexidine (PERIDEX) 0.12 % solution Use as directed 15 mLs in the mouth or throat 2 (two) times daily. 08/20/20   Robyn Haber, MD  fexofenadine-pseudoephedrine (ALLEGRA-D 24) 180-240 MG 24 hr tablet Take 1 tablet by mouth daily.    [provider]  fluconazole (DIFLUCAN) 100 MG tablet Take 1 tablet (100 mg total) by mouth daily. 08/20/20   Robyn Haber, MD  lansoprazole (PREVACID) 15 MG capsule Take 15 mg by mouth daily at 12 noon.    [provider]  levothyroxine (SYNTHROID, LEVOTHROID) 200 MCG tablet Take 175 mcg by mouth daily before breakfast.     [provider]  olmesartan-hydrochlorothiazide (BENICAR HCT) 40-25 MG tablet Take 1 tablet by mouth daily.    [provider]  ondansetron (ZOFRAN ODT) 4 MG disintegrating tablet Take one tab by mouth Q6hr prn nausea 04/12/20   Kandra Nicolas, MD     Objective    Physical Exam: Vitals:   09/01/21 2230 09/01/21 2300 09/01/21 2330 09/02/21 0033  BP: 121/76 118/90 107/67   Pulse: (!) 109 (!) 105 94   Resp: 18     Temp:    98.5 F (36.9 C)   TempSrc:    Oral  SpO2: 97% 96% 94%   Weight:      Height:        General: appears to be stated age; alert, oriented Skin: warm, dry, no rash Head:  AT/[place] Mouth:  Oral mucosa membranes appear dry, normal dentition Neck: supple; trachea midline Heart:  RRR; did not appreciate any M/R/G Lungs: CTAB, did not appreciate any wheezes, rales, or rhonchi Abdomen: + BS; soft, ND; mild tenderness to palpation over the left lower abdominal quadrant, in the absence of any associated guarding, rigidity, or rebound tenderness. Vascular: 2+ pedal pulses b/l; 2+ radial pulses b/l Extremities: no peripheral edema, no muscle wasting Neuro: strength and sensation intact in upper and lower extremities b/l  Labs on Admission: I have personally reviewed following labs and imaging studies  CBC: Recent Labs  Lab 09/01/21 1956  WBC 13.7*  NEUTROABS 11.1*  HGB 14.4  HCT 43.1  MCV 83.4  PLT 803   Basic Metabolic Panel: Recent Labs  Lab 09/01/21 1956  NA 136  K 3.8  CL 99  CO2 29  GLUCOSE 115*  BUN 11  CREATININE 1.16  CALCIUM 9.0   GFR: Estimated Creatinine Clearance: 84.9 mL/min (by C-G formula based on SCr of 1.16 mg/dL). Liver  Function Tests: Recent Labs  Lab 09/01/21 1956  AST 25  ALT 28  ALKPHOS 48  BILITOT 0.7  PROT 6.9  ALBUMIN 3.9   Recent Labs  Lab 09/01/21 1956  LIPASE 26   No results for input(s): AMMONIA in the last 168 hours. Coagulation Profile: No results for input(s): INR, PROTIME in the last 168 hours. Cardiac Enzymes: No results for input(s): CKTOTAL, CKMB, CKMBINDEX, TROPONINI in the last 168 hours. BNP (last 3 results) No results for input(s): PROBNP in the last 8760 hours. HbA1C: No results for input(s): HGBA1C in the last 72 hours. CBG: No results for input(s): GLUCAP in the last 168 hours. Lipid Profile: No results for input(s): CHOL, HDL, LDLCALC, TRIG, CHOLHDL, LDLDIRECT in the last 72 hours. Thyroid Function Tests: No results for input(s):  TSH, T4TOTAL, FREET4, T3FREE, THYROIDAB in the last 72 hours. Anemia Panel: No results for input(s): VITAMINB12, FOLATE, FERRITIN, TIBC, IRON, RETICCTPCT in the last 72 hours. Urine analysis:    Component Value Date/Time   COLORURINE YELLOW 09/01/2021 1935   APPEARANCEUR CLEAR 09/01/2021 1935   LABSPEC 1.020 09/01/2021 1935   PHURINE 7.0 09/01/2021 1935   GLUCOSEU NEGATIVE 09/01/2021 1935   HGBUR NEGATIVE 09/01/2021 1935   BILIRUBINUR NEGATIVE 09/01/2021 1935   BILIRUBINUR negative 09/01/2021 [place] NEGATIVE 09/01/2021 1935   KETONESUR negative 09/01/2021 [place] NEGATIVE 09/01/2021 1935   PROTEINUR negative 09/01/2021 1842   UROBILINOGEN 0.2 09/01/2021 1842   NITRITE NEGATIVE 09/01/2021 1935   NITRITE Negative 09/01/2021 1842   LEUKOCYTESUR NEGATIVE 09/01/2021 1935   LEUKOCYTESUR Negative 09/01/2021 1842    Radiological Exams on Admission: CT Abdomen Pelvis W Contrast  Result Date: 09/01/2021 CLINICAL DATA:  Left-sided pain EXAM: CT ABDOMEN AND PELVIS WITH CONTRAST TECHNIQUE: Multidetector CT imaging of the abdomen and pelvis was performed using the standard protocol following bolus administration of intravenous contrast. CONTRAST:  132m OMNIPAQUE IOHEXOL 300 MG/ML  SOLN COMPARISON:  CT 04/13/2020 FINDINGS: Lower chest: No acute abnormality. Hepatobiliary: No focal liver abnormality is seen. No gallstones, gallbladder wall thickening, or biliary dilatation. Pancreas: Unremarkable. No pancreatic ductal dilatation or surrounding inflammatory changes. Spleen: Normal in size without focal abnormality. Adrenals/Urinary Tract: Adrenal glands are unremarkable. Kidneys are normal, without renal calculi, focal lesion, or hydronephrosis. Bladder is unremarkable. Stomach/Bowel: The stomach is nonenlarged. No dilated small bowel. Negative appendix. Left colon diverticular disease. Focal wall thickening at the sigmoid colon. Moderate inflammatory changes in the left lower  quadrant fat. Vascular/Lymphatic: Nonaneurysmal aorta.  No suspicious nodes. Reproductive: Enlarged prostate Other: Multiple small foci of extraluminal gas within the left lower quadrant and central pelvic fat slightly to the left of midline, series 3, image 64, consistent with perforation. Musculoskeletal: No acute osseous abnormality. Small fat containing umbilical hernia with mild soft tissue stranding within and around the hernia. IMPRESSION: 1. Left lower quadrant inflammatory process. Focally thickened sigmoid colon with diverticula present, findings most likely due to acute diverticulitis. Multiple foci of extraluminal gas in the left lower quadrant and central pelvis consistent with perforation. No abscess 2. Small fat containing umbilical hernia with inflammatory changes around the hernia and within the hernia sac, correlate for symptoms of incarcerated fat hernia Critical Value/emergent results were called by telephone at the time of interpretation on 09/01/2021 at 9:43 pm [facility] who verbally acknowledged these results. Electronically Signed   By: KDonavan FoilM.D.   On: 09/01/2021 21:43       Assessment/Plan  Principal Problem:   Acute diverticulitis Active Problems:   Abdominal pain   Nausea   Sepsis ([place])   Thyroid disease   GERD (gastroesophageal reflux disease)   Hypertension   BPH (benign prostatic hyperplasia)     #) sepsis due to acute diverticulitis with microperforation: 2 3 days of progressive left lower quadrant abdominal discomfort with subjective fever and leukocytosis, with CT abdomen/pelvis evidence of acute diverticulitis with evidence of microperforation.  Physical exam reveals evidence of associated acute peritoneal signs, although patient appears hemodynamically stable.  SIRS criteria met the leukocytosis and tachypnea.  Patient's sepsis does not meet criteria to be considered severe in nature in the absence of any associated evidence of  endorgan damage.  will check a gas level now as well as check blood cultures, particular in the setting of suspected microperforation.  Continue Zosyn which we will continue to provide anaerobic coverage in the setting of suspected intra-abdominal source.   EDP discussed the patient's case and imaging with the on-call general surgeon, Dr. Rosendo Gros, who recommended admission to the hospital service for plan for general surgery consultation, and recommended interval n.p.o., IV fluids, IV antibiotics with anaerobic coverage, as well as symptomatic management.  Of note, no additional evidence of underlying infectious process at this time including urinalysis that was inconsistent with UTI, while COVID-19/influenza PCR found to be negative.  No acute respiratory symptoms to warrant proceeding with chest x-ray at this time.    Plan: NPO.  Continue Zosyn.  Continuous lactated Ringer's.  Check lactic acid level.  Cultures times 2 repeat CBC with differential in the morning.  Repeat CMP in the morning check INR.  As needed IV Dilaudid.  As needed IV Zofran as well as as needed IV Phenergan nausea refractory to Zofran General surgery consulted, as above.       #) Essential Hypertension: documented history of such, with outpatient antihypertensive regimen including olmesartan as well as HCTZ.  Systolic blood pressures pressure in the ED today noted to be normotensive.  In the setting of presenting sepsis due to diverticulitis with evidence of microperforation, will admit hypertensive medications for now.  Plan: Close monitoring of subsequent blood pressure via routine vital signs.  Hold home antihypertensive medications for now.      #) Acquired hypothyroidism: Documented history of such, on Synthroid as an outpatient.  Plan: In the setting of current n.p.o. status, will hold home Synthroid for now.       #) GERD: Documented history of such, with patient on lansoprazole as an outpatient.  Plan:  In the setting of current n.p.o. status, will hold home lansoprazole but rather advised in order for Protonix 40 mg IV daily while remaining n.p.o.       #) Benign prostatic hyperplasia: On alfuzosin as an outpatient.  Due to potential antihypertensive implications of this alpha-1 blocker or as well as in the setting of current n.p.o. status, will hold this medication for now.  Plan: Hold home alfuzosin for now, as above.  Monitor strict I's and O's and daily weights.  Repeat CMP in the morning.     DVT prophylaxis: scd's   Code Status: Full code Family Communication: none Disposition Plan: Per Rounding Team Consults called: case discussed with general surgery, as further detailed above;  Admission status: Inpatient; med telemetry     Of note, this patient was added by me to the following Admit List/Treatment Team: [email or web address].    Of note, the Adult Admission Order Set (Multimorbid order set) was  used by me in the admission process for this patient.   PLEASE NOTE THAT DRAGON DICTATION SOFTWARE WAS USED IN THE CONSTRUCTION OF THIS NOTE.   Rhetta Mura DO [facility] Pager [phone] From [place]   09/02/2021, 1:41 AM

## 2021-09-02 NOTE — Consult Note (Signed)
Reason for Consult:diverticulitis Referring Physician: Dr Webb Silversmith Wayne Gross is an 60 y.o. male.   HPI: 84 yom with htn and bph who has had what sounds like couple episodes of diverticulitis treated by pcp with abx. Has history of csc (states he thinks he is about a year overdue right now).  He has several days of progressive llq abdominal pain that was not getting better. Nothing doing at home was helping.  No fevers.  He is voiding. Came to er and is noted to have sigmoid diverticulitis with microperforation.   Past Medical History:  Diagnosis Date  . Diverticulitis   . GERD (gastroesophageal reflux disease)   . Hypertension   . Thyroid disease     Past Surgical History:  Procedure Laterality Date  . ROTATOR CUFF REPAIR Right     Family History  Problem Relation Age of Onset  . Cancer Mother   . Healthy Father     Social History:  reports that he has never smoked. He has never used smokeless tobacco. He reports current alcohol use. He reports that he does not use drugs.  Allergies: No Known Allergies  Medications: Prior to Admission:  Medications Prior to Admission  Medication Sig Dispense Refill Last Dose  . alfuzosin (UROXATRAL) 10 MG 24 hr tablet Take 10 mg by mouth daily with breakfast.   09/01/2021 at AM  . lansoprazole (PREVACID) 15 MG capsule Take 15 mg by mouth daily at 12 noon.   09/01/2021 at AM  . levothyroxine (SYNTHROID) 175 MCG tablet Take 175 mcg by mouth daily before breakfast.    09/01/2021 at AM  . olmesartan-hydrochlorothiazide (BENICAR HCT) 40-25 MG tablet Take 1 tablet by mouth daily.   09/01/2021 at AM    Results for orders placed or performed during the hospital encounter of 09/01/21 (from the past 48 hour(s))  Urinalysis, Routine w reflex microscopic Urine, Clean Catch     Status: None   Collection Time: 09/01/21  7:35 PM  Result Value Ref Range   Color, Urine YELLOW YELLOW   APPearance CLEAR CLEAR   Specific Gravity, Urine 1.020 1.005 -  1.030   pH 7.0 5.0 - 8.0   Glucose, UA NEGATIVE NEGATIVE mg/dL   Hgb urine dipstick NEGATIVE NEGATIVE   Bilirubin Urine NEGATIVE NEGATIVE   Ketones, ur NEGATIVE NEGATIVE mg/dL   Protein, ur NEGATIVE NEGATIVE mg/dL   Nitrite NEGATIVE NEGATIVE   Leukocytes,Ua NEGATIVE NEGATIVE    Comment: Microscopic not done on urines with negative protein, blood, leukocytes, nitrite, or glucose < 500 mg/dL. Performed at [facility], [address] [place], [place] [address]   CBC with Differential     Status: Abnormal   Collection Time: 09/01/21  7:56 PM  Result Value Ref Range   WBC 13.7 (H) 4.0 - 10.5 K/uL   RBC 5.17 4.22 - 5.81 MIL/uL   Hemoglobin 14.4 13.0 - 17.0 g/dL   HCT 30.1 60.1 - 09.3 %   MCV 83.4 80.0 - 100.0 fL   MCH 27.9 26.0 - 34.0 pg   MCHC 33.4 30.0 - 36.0 g/dL   RDW 23.5 57.3 - 22.0 %   Platelets 203 150 - 400 K/uL   nRBC 0.0 0.0 - 0.2 %   Neutrophils Relative % 82 %   Neutro Abs 11.1 (H) 1.7 - 7.7 K/uL   Lymphocytes Relative 9 %   Lymphs Abs 1.3 0.7 - 4.0 K/uL   Monocytes Relative 7 %   Monocytes Absolute 0.9 0.1 -  1.0 K/uL   Eosinophils Relative 2 %   Eosinophils Absolute 0.3 0.0 - 0.5 K/uL   Basophils Relative 0 %   Basophils Absolute 0.1 0.0 - 0.1 K/uL   Immature Granulocytes 0 %   Abs Immature Granulocytes 0.06 0.00 - 0.07 K/uL    Comment: Performed at [facility], [address] [place], [place] [address]  Comprehensive metabolic panel     Status: Abnormal   Collection Time: 09/01/21  7:56 PM  Result Value Ref Range   Sodium 136 135 - 145 mmol/L   Potassium 3.8 3.5 - 5.1 mmol/L   Chloride 99 98 - 111 mmol/L   CO2 29 22 - 32 mmol/L   Glucose, Bld 115 (H) 70 - 99 mg/dL    Comment: Glucose reference range applies only to samples taken after fasting for at least 8 hours.   BUN 11 8 - 23 mg/dL   Creatinine, Ser 1.91 0.61 - 1.24 mg/dL   Calcium 9.0 8.9 - 47.8 mg/dL   Total Protein 6.9 6.5 - 8.1 g/dL   Albumin 3.9 3.5 - 5.0 g/dL   AST 25 15  - 41 U/L   ALT 28 0 - 44 U/L   Alkaline Phosphatase 48 38 - 126 U/L   Total Bilirubin 0.7 0.3 - 1.2 mg/dL   GFR, Estimated >29 >56 mL/min    Comment: (NOTE) Calculated using the CKD-EPI Creatinine Equation (2021)    Anion gap 8 5 - 15    Comment: Performed at [facility], [address] [place], [place] [address]  Lipase, blood     Status: None   Collection Time: 09/01/21  7:56 PM  Result Value Ref Range   Lipase 26 11 - 51 U/L    Comment: Performed at [facility], [address] [place], [place] [address]  Resp Panel by RT-PCR (Flu A&B, Covid) Nasopharyngeal Swab     Status: None   Collection Time: 09/01/21  9:54 PM   Specimen: Nasopharyngeal Swab; Nasopharyngeal(NP) swabs in vial transport medium  Result Value Ref Range   SARS Coronavirus 2 by RT PCR NEGATIVE NEGATIVE    Comment: (NOTE) SARS-CoV-2 target nucleic acids are NOT DETECTED.  The SARS-CoV-2 RNA is generally detectable in upper respiratory specimens during the acute phase of infection. The lowest concentration of SARS-CoV-2 viral copies this assay can detect is 138 copies/mL. A negative result does not preclude SARS-Cov-2 infection and should not be used as the sole basis for treatment or other patient management decisions. A negative result may occur with  improper specimen collection/handling, submission of specimen other than nasopharyngeal swab, presence of viral mutation(s) within the areas targeted by this assay, and inadequate number of viral copies(<138 copies/mL). A negative result must be combined with clinical observations, patient history, and epidemiological information. The expected result is Negative.  Fact Sheet for Patients:  BloggerCourse.com  Fact Sheet for Healthcare Providers:  SeriousBroker.it  This test is no t yet approved or cleared by the [place] FDA and  has been authorized for detection and/or diagnosis  of SARS-CoV-2 by FDA under an Emergency Use Authorization (EUA). This EUA will remain  in effect (meaning this test can be used) for the duration of the COVID-19 declaration under Section 564(b)(1) of the Act, 21 U.S.C.section 360bbb-3(b)(1), unless the authorization is terminated  or revoked sooner.       Influenza A by PCR NEGATIVE NEGATIVE   Influenza B by PCR NEGATIVE NEGATIVE  Comment: (NOTE) The Xpert Xpress SARS-CoV-2/FLU/RSV plus assay is intended as an aid in the diagnosis of influenza from Nasopharyngeal swab specimens and should not be used as a sole basis for treatment. Nasal washings and aspirates are unacceptable for Xpert Xpress SARS-CoV-2/FLU/RSV testing.  Fact Sheet for Patients: BloggerCourse.com  Fact Sheet for Healthcare Providers: SeriousBroker.it  This test is not yet approved or cleared by the [place] FDA and has been authorized for detection and/or diagnosis of SARS-CoV-2 by FDA under an Emergency Use Authorization (EUA). This EUA will remain in effect (meaning this test can be used) for the duration of the COVID-19 declaration under Section 564(b)(1) of the Act, 21 U.S.C. section 360bbb-3(b)(1), unless the authorization is terminated or revoked.  Performed at [facility], [address] [place], [place] [address]   CBC with Differential/Platelet     Status: Abnormal   Collection Time: 09/02/21  6:44 AM  Result Value Ref Range   WBC 11.1 (H) 4.0 - 10.5 K/uL   RBC 4.60 4.22 - 5.81 MIL/uL   Hemoglobin 12.8 (L) 13.0 - 17.0 g/dL   HCT 92.0 10.0 - 71.2 %   MCV 86.3 80.0 - 100.0 fL   MCH 27.8 26.0 - 34.0 pg   MCHC 32.2 30.0 - 36.0 g/dL   RDW 19.7 58.8 - 32.5 %   Platelets 173 150 - 400 K/uL   nRBC 0.0 0.0 - 0.2 %   Neutrophils Relative % 77 %   Neutro Abs 8.6 (H) 1.7 - 7.7 K/uL   Lymphocytes Relative 12 %   Lymphs Abs 1.3 0.7 - 4.0 K/uL   Monocytes Relative 8 %   Monocytes  Absolute 0.9 0.1 - 1.0 K/uL   Eosinophils Relative 2 %   Eosinophils Absolute 0.2 0.0 - 0.5 K/uL   Basophils Relative 0 %   Basophils Absolute 0.0 0.0 - 0.1 K/uL   Immature Granulocytes 1 %   Abs Immature Granulocytes 0.08 (H) 0.00 - 0.07 K/uL    Comment: Performed at [facility], [address], [place], [place] [address]  Protime-INR     Status: None   Collection Time: 09/02/21  6:44 AM  Result Value Ref Range   Prothrombin Time 13.8 11.4 - 15.2 seconds   INR 1.1 0.8 - 1.2    Comment: (NOTE) INR goal varies based on device and disease states. Performed at [facility], [address], [place], [place] [address]     CT Abdomen Pelvis W Contrast  Result Date: 09/01/2021 CLINICAL DATA:  Left-sided pain EXAM: CT ABDOMEN AND PELVIS WITH CONTRAST TECHNIQUE: Multidetector CT imaging of the abdomen and pelvis was performed using the standard protocol following bolus administration of intravenous contrast. CONTRAST:  OMNIPAQUE IOHEXOL 300 MG/ML  SOLN COMPARISON:  CT 04/13/2020 FINDINGS: Lower chest: No acute abnormality. Hepatobiliary: No focal liver abnormality is seen. No gallstones, gallbladder wall thickening, or biliary dilatation. Pancreas: Unremarkable. No pancreatic ductal dilatation or surrounding inflammatory changes. Spleen: Normal in size without focal abnormality. Adrenals/Urinary Tract: Adrenal glands are unremarkable. Kidneys are normal, without renal calculi, focal lesion, or hydronephrosis. Bladder is unremarkable. Stomach/Bowel: The stomach is nonenlarged. No dilated small bowel. Negative appendix. Left colon diverticular disease. Focal wall thickening at the sigmoid colon. Moderate inflammatory changes in the left lower quadrant fat. Vascular/Lymphatic: Nonaneurysmal aorta.  No suspicious nodes. Reproductive: Enlarged prostate Other: Multiple small foci of extraluminal gas within the left lower quadrant and central pelvic fat slightly to  the left  of midline, series 3, image 64, consistent with perforation. Musculoskeletal: No acute osseous abnormality. Small fat containing umbilical hernia with mild soft tissue stranding within and around the hernia. IMPRESSION: 1. Left lower quadrant inflammatory process. Focally thickened sigmoid colon with diverticula present, findings most likely due to acute diverticulitis. Multiple foci of extraluminal gas in the left lower quadrant and central pelvis consistent with perforation. No abscess 2. Small fat containing umbilical hernia with inflammatory changes around the hernia and within the hernia sac, correlate for symptoms of incarcerated fat hernia Critical Value/emergent results were called by telephone at the time of interpretation on 09/01/2021 at 9:43 pm to provider [facility] , who verbally acknowledged these results. Electronically Signed   By: Jasmine Pang M.D.   On: 09/01/2021 21:43    Review of Systems  Constitutional:  Positive for chills. Negative for fever.  Gastrointestinal:  Positive for abdominal pain and constipation.  All other systems reviewed and are negative. Blood pressure 134/83, pulse 99, temperature 98.1 F (36.7 C), temperature source Oral, resp. rate 16, height 6' (1.829 m), weight 108 kg, SpO2 95 %. Physical Exam Constitutional:      Appearance: He is well-developed.  Eyes:     General: No scleral icterus. Cardiovascular:     Rate and Rhythm: Normal rate.  Pulmonary:     Effort: Pulmonary effort is normal.  Abdominal:     General: Bowel sounds are normal.     Palpations: Abdomen is soft.     Tenderness: There is abdominal tenderness in the suprapubic area and left lower quadrant.  Neurological:     General: No focal deficit present.     Mental Status: He is alert.  Psychiatric:        Mood and Affect: Mood normal.        Behavior: Behavior normal.    Assessment/Plan: Diverticulitis -I dont think needs surgery now. - zosyn, sips with meds/ice  ok -hopefully will resolve, discussed surgery indications with him as well as drain possibility -lovenox fine from my standpoint for prophylaxis -will follow  Emelia Loron 09/02/2021, 7:53 AM

## 2021-09-03 DIAGNOSIS — I10 Essential (primary) hypertension: Secondary | ICD-10-CM | POA: Diagnosis not present

## 2021-09-03 DIAGNOSIS — K5792 Diverticulitis of intestine, part unspecified, without perforation or abscess without bleeding: Secondary | ICD-10-CM | POA: Diagnosis not present

## 2021-09-03 LAB — BLOOD CULTURE ID PANEL (REFLEXED) - BCID2

## 2021-09-03 LAB — CBC
HCT: 41.3 % (ref 39.0–52.0)
Hemoglobin: 13.2 g/dL (ref 13.0–17.0)
MCH: 27.7 pg (ref 26.0–34.0)
MCHC: 32 g/dL (ref 30.0–36.0)
MCV: 86.8 fL (ref 80.0–100.0)
Platelets: 185 10*3/uL (ref 150–400)
RBC: 4.76 MIL/uL (ref 4.22–5.81)
RDW: 14.7 % (ref 11.5–15.5)
WBC: 10.1 10*3/uL (ref 4.0–10.5)
nRBC: 0 % (ref 0.0–0.2)

## 2021-09-03 LAB — COMPREHENSIVE METABOLIC PANEL
ALT: 18 U/L (ref 0–44)
AST: 17 U/L (ref 15–41)
Albumin: 3.5 g/dL (ref 3.5–5.0)
Alkaline Phosphatase: 36 U/L — ABNORMAL LOW (ref 38–126)
Anion gap: 6 (ref 5–15)
BUN: 13 mg/dL (ref 8–23)
CO2: 30 mmol/L (ref 22–32)
Calcium: 8.7 mg/dL — ABNORMAL LOW (ref 8.9–10.3)
Chloride: 100 mmol/L (ref 98–111)
Creatinine, Ser: 1.44 mg/dL — ABNORMAL HIGH (ref 0.61–1.24)
GFR, Estimated: 55 mL/min — ABNORMAL LOW (ref >60)
Glucose, Bld: 156 mg/dL — ABNORMAL HIGH (ref 70–99)
Potassium: 3.5 mmol/L (ref 3.5–5.1)
Sodium: 136 mmol/L (ref 135–145)
Total Bilirubin: 2 mg/dL — ABNORMAL HIGH (ref 0.3–1.2)
Total Protein: 6.6 g/dL (ref 6.5–8.1)

## 2021-09-03 MED ORDER — ALFUZOSIN HCL ER 10 MG PO TB24
10.0000 mg | ORAL_TABLET | Freq: Every day | ORAL | Status: DC
  Administered 2021-09-03 – 2021-09-06 (×4): 10 mg via ORAL
  Filled 2021-09-03 (×4): qty 1

## 2021-09-03 NOTE — Progress Notes (Signed)
PHARMACY - PHYSICIAN COMMUNICATION CRITICAL VALUE ALERT - BLOOD CULTURE IDENTIFICATION (BCID)  Wayne Gross is an 60 y.o. male who presented to [facility] on 09/01/2021 with a chief complaint of acute diverticulitis   Assessment:  1 of 3 bottles from blood cultures growing coag negative staph so presumed contaminant  Name of physician (or Provider) ContactedRhona Leavens  Current antibiotics: zosyn for diverticulitis  Changes to prescribed antibiotics recommended:  Patient is on recommended antibiotics - No changes needed  Results for orders placed or performed during the hospital encounter of 09/01/21  Blood Culture ID Panel (Reflexed) (Collected: 09/02/2021  6:44 AM)  Result Value Ref Range   Enterococcus faecalis NOT DETECTED NOT DETECTED   Enterococcus Faecium NOT DETECTED NOT DETECTED   Listeria monocytogenes NOT DETECTED NOT DETECTED   Staphylococcus species DETECTED (A) NOT DETECTED   Staphylococcus aureus (BCID) NOT DETECTED NOT DETECTED   Staphylococcus epidermidis NOT DETECTED NOT DETECTED   Staphylococcus lugdunensis NOT DETECTED NOT DETECTED   Streptococcus species NOT DETECTED NOT DETECTED   Streptococcus agalactiae NOT DETECTED NOT DETECTED   Streptococcus pneumoniae NOT DETECTED NOT DETECTED   Streptococcus pyogenes NOT DETECTED NOT DETECTED   A.calcoaceticus-baumannii NOT DETECTED NOT DETECTED   Bacteroides fragilis NOT DETECTED NOT DETECTED   Enterobacterales NOT DETECTED NOT DETECTED   Enterobacter cloacae complex NOT DETECTED NOT DETECTED   Escherichia coli NOT DETECTED NOT DETECTED   Klebsiella aerogenes NOT DETECTED NOT DETECTED   Klebsiella oxytoca NOT DETECTED NOT DETECTED   Klebsiella pneumoniae NOT DETECTED NOT DETECTED   Proteus species NOT DETECTED NOT DETECTED   Salmonella species NOT DETECTED NOT DETECTED   Serratia marcescens NOT DETECTED NOT DETECTED   Haemophilus influenzae NOT DETECTED NOT DETECTED   Neisseria meningitidis NOT DETECTED NOT  DETECTED   Pseudomonas aeruginosa NOT DETECTED NOT DETECTED   Stenotrophomonas maltophilia NOT DETECTED NOT DETECTED   Candida albicans NOT DETECTED NOT DETECTED   Candida auris NOT DETECTED NOT DETECTED   Candida glabrata NOT DETECTED NOT DETECTED   Candida krusei NOT DETECTED NOT DETECTED   Candida parapsilosis NOT DETECTED NOT DETECTED   Candida tropicalis NOT DETECTED NOT DETECTED   Cryptococcus neoformans/gattii NOT DETECTED NOT DETECTED    [name] 09/03/2021  8:09 AM

## 2021-09-03 NOTE — Progress Notes (Signed)
PROGRESS NOTE    Wayne Gross  PPJ:[id number] DOB: 03-Nov-1961 DOA: 09/01/2021 PCP: Jim Like, MD    Brief Narrative:  [phone] with hx HTN, GERD presented with abd pain, found to have acute diverticulitis with microperforation. General Surgery consulted  Assessment & Plan:   Principal Problem:   Acute diverticulitis Active Problems:   Abdominal pain   Nausea   Sepsis (HCC)   Thyroid disease   GERD (gastroesophageal reflux disease)   Hypertension   BPH (benign prostatic hyperplasia)    #) sepsis due to acute diverticulitis with microperforation:  -currently continued on zosyn -General Surgery following recs for cont regimen -advancing diet slowly per general surgery -Pt seen this AM after tolerating bowl of broth -repeat cbc in AM   #) Essential Hypertension:  -BP stable at this time -cont on home bp meds as tolerated   #) Acquired hypothyroidism:  -Continued thyroid replacement as tolerated   #) GERD: -cont on IV protonix currently   #) Benign prostatic hyperplasia:  -alfuzosin on hold at time of presentation  DVT prophylaxis: SCD's Code Status: Full Family Communication: Pt in room, family is currently at bedside  Status is: Inpatient  Remains inpatient appropriate because: severity of illness   Consultants:  General Surgery  Procedures:    Antimicrobials: Anti-infectives (From admission, onward)    Start     Dose/Rate Route Frequency Ordered Stop   09/02/21 0600  piperacillin-tazobactam (ZOSYN) IVPB 3.375 g        3.375 g 12.5 mL/hr over 240 Minutes Intravenous Every 8 hours 09/02/21 0148     09/01/21 2145  piperacillin-tazobactam (ZOSYN) IVPB 3.375 g        3.375 g 100 mL/hr over 30 Minutes Intravenous  Once 09/01/21 2143 09/01/21 2257       Subjective: Reports feeling better. Still having soreness in lower quadrants. Reports abd seems more distended today  Objective: Vitals:   09/02/21 2145 09/03/21 0452 09/03/21 0457 09/03/21  1521  BP: 131/80 133/82  116/83  Pulse: 99 100  92  Resp: 17 16  18   Temp: 97.8 F (36.6 C) 98.2 F (36.8 C)  98.5 F (36.9 C)  TempSrc: Oral Oral  Oral  SpO2: 94% 94%  99%  Weight:   106.8 kg   Height:        Intake/Output Summary (Last 24 hours) at 09/03/2021 1701 Last data filed at 09/03/2021 1335 Gross per 24 hour  Intake --  Output 750 ml  Net -750 ml    Filed Weights   09/01/21 1918 09/03/21 0457  Weight: 108 kg 106.8 kg    Examination: General exam: Conversant, in no acute distress Respiratory system: normal chest rise, clear, no audible wheezing Cardiovascular system: regular rhythm, s1-s2 Gastrointestinal system: Mildly distended, decreased BS Central nervous system: No seizures, no tremors Extremities: No cyanosis, no joint deformities Skin: No rashes, no pallor Psychiatry: Affect normal // no auditory hallucinations   Data Reviewed: I have personally reviewed following labs and imaging studies  CBC: Recent Labs  Lab 09/01/21 1956 09/02/21 0644 09/03/21 1331  WBC 13.7* 11.1* 10.1  NEUTROABS 11.1* 8.6*  --   HGB 14.4 12.8* 13.2  HCT 43.1 39.7 41.3  MCV 83.4 86.3 86.8  PLT 203 173 185    Basic Metabolic Panel: Recent Labs  Lab 09/01/21 1956 09/02/21 0644 09/03/21 1331  NA 136 137 136  K 3.8 3.5 3.5  CL 99 105 100  CO2 29 28 30   GLUCOSE 115* 117* 156*  BUN 11 12 13   CREATININE 1.16 1.24 1.44*  CALCIUM 9.0 8.5* 8.7*  MG  --  1.8  1.8  --     GFR: Estimated Creatinine Clearance: 68 mL/min (A) (by C-G formula based on SCr of 1.44 mg/dL (H)). Liver Function Tests: Recent Labs  Lab 09/01/21 1956 09/02/21 0644 09/03/21 1331  AST 25 18 17   ALT 28 22 18   ALKPHOS 48 39 36*  BILITOT 0.7 1.2 2.0*  PROT 6.9 6.0* 6.6  ALBUMIN 3.9 3.4* 3.5    Recent Labs  Lab 09/01/21 1956  LIPASE 26    No results for input(s): AMMONIA in the last 168 hours. Coagulation Profile: Recent Labs  Lab 09/02/21 0644  INR 1.1    Cardiac  Enzymes: No results for input(s): CKTOTAL, CKMB, CKMBINDEX, TROPONINI in the last 168 hours. BNP (last 3 results) No results for input(s): PROBNP in the last 8760 hours. HbA1C: No results for input(s): HGBA1C in the last 72 hours. CBG: No results for input(s): GLUCAP in the last 168 hours. Lipid Profile: No results for input(s): CHOL, HDL, LDLCALC, TRIG, CHOLHDL, LDLDIRECT in the last 72 hours. Thyroid Function Tests: No results for input(s): TSH, T4TOTAL, FREET4, T3FREE, THYROIDAB in the last 72 hours. Anemia Panel: No results for input(s): VITAMINB12, FOLATE, FERRITIN, TIBC, IRON, RETICCTPCT in the last 72 hours. Sepsis Labs: Recent Labs  Lab 09/02/21 0644 09/02/21 0825  LATICACIDVEN 0.9 1.1     Recent Results (from the past 240 hour(s))  Resp Panel by RT-PCR (Flu A&B, Covid) Nasopharyngeal Swab     Status: None   Collection Time: 09/01/21  9:54 PM   Specimen: Nasopharyngeal Swab; Nasopharyngeal(NP) swabs in vial transport medium  Result Value Ref Range Status   SARS Coronavirus 2 by RT PCR NEGATIVE NEGATIVE Final    Comment: (NOTE) SARS-CoV-2 target nucleic acids are NOT DETECTED.  The SARS-CoV-2 RNA is generally detectable in upper respiratory specimens during the acute phase of infection. The lowest concentration of SARS-CoV-2 viral copies this assay can detect is 138 copies/mL. A negative result does not preclude SARS-Cov-2 infection and should not be used as the sole basis for treatment or other patient management decisions. A negative result may occur with  improper specimen collection/handling, submission of specimen other than nasopharyngeal swab, presence of viral mutation(s) within the areas targeted by this assay, and inadequate number of viral copies(<138 copies/mL). A negative result must be combined with clinical observations, patient history, and epidemiological information. The expected result is Negative.  Fact Sheet for Patients:   09/04/21  Fact Sheet for Healthcare Providers:  09/04/21  This test is no t yet approved or cleared by the 09/03/21 FDA and  has been authorized for detection and/or diagnosis of SARS-CoV-2 by FDA under an Emergency Use Authorization (EUA). This EUA will remain  in effect (meaning this test can be used) for the duration of the COVID-19 declaration under Section 564(b)(1) of the Act, 21 U.S.C.section 360bbb-3(b)(1), unless the authorization is terminated  or revoked sooner.       Influenza A by PCR NEGATIVE NEGATIVE Final   Influenza B by PCR NEGATIVE NEGATIVE Final    Comment: (NOTE) The Xpert Xpress SARS-CoV-2/FLU/RSV plus assay is intended as an aid in the diagnosis of influenza from Nasopharyngeal swab specimens and should not be used as a sole basis for treatment. Nasal washings and aspirates are unacceptable for Xpert Xpress SARS-CoV-2/FLU/RSV testing.  Fact Sheet for Patients: BloggerCourse.com  Fact Sheet for Healthcare Providers: SeriousBroker.it  This  test is not yet approved or cleared by the [place] and has been authorized for detection and/or diagnosis of SARS-CoV-2 by FDA under an Emergency Use Authorization (EUA). This EUA will remain in effect (meaning this test can be used) for the duration of the COVID-19 declaration under Section 564(b)(1) of the Act, 21 U.S.C. section 360bbb-3(b)(1), unless the authorization is terminated or revoked.  Performed at [facility], [address] [place], [place] [address]   Culture, blood (Routine X 2) w Reflex to ID Panel     Status: None (Preliminary result)   Collection Time: 09/02/21  6:44 AM   Specimen: BLOOD  Result Value Ref Range Status   Specimen Description   Final    BLOOD RIGHT ANTECUBITAL Performed at [facility], [address] [place],  [place] [address]    Special Requests   Final    BOTTLES DRAWN AEROBIC AND ANAEROBIC Blood Culture adequate volume Performed at [facility], [address] [place], [place] [address]    Culture  Setup Time   Final    GRAM POSITIVE COCCI AEROBIC BOTTLE ONLY CRITICAL RESULT CALLED TO, READ BACK BY AND VERIFIED WITH: M,JAMES PHARMD @0732  09/03/21 EB Performed at [facility], [address] [place], [place]    Culture GRAM POSITIVE COCCI  Final   Report Status PENDING  Incomplete  Culture, blood (Routine X 2) w Reflex to ID Panel     Status: None (Preliminary result)   Collection Time: 09/02/21  6:44 AM   Specimen: BLOOD  Result Value Ref Range Status   Specimen Description   Final    BLOOD BLOOD RIGHT FOREARM Performed at [facility], [address] [place], [place]    Special Requests   Final    BOTTLES DRAWN AEROBIC ONLY Blood Culture results may not be optimal due to an inadequate volume of blood received in culture bottles Performed at [facility], [address] [place], [place]    Culture   Final    NO GROWTH 1 DAY Performed at [facility], [address] [place], [place]    Report Status PENDING  Incomplete  Blood Culture ID Panel (Reflexed)     Status: Abnormal   Collection Time: 09/02/21  6:44 AM  Result Value Ref Range Status   Enterococcus faecalis NOT DETECTED NOT DETECTED Final   Enterococcus Faecium NOT DETECTED NOT DETECTED Final   Listeria monocytogenes NOT DETECTED NOT DETECTED Final   Staphylococcus species DETECTED (A) NOT DETECTED Final    Comment: CRITICAL RESULT CALLED TO, READ BACK BY AND VERIFIED WITH: M,JAMES PHARMD @0732  09/03/21 EB    Staphylococcus aureus (BCID) NOT DETECTED NOT DETECTED Final   Staphylococcus epidermidis NOT DETECTED NOT DETECTED Final   Staphylococcus lugdunensis NOT DETECTED NOT DETECTED Final   Streptococcus species  NOT DETECTED NOT DETECTED Final   Streptococcus agalactiae NOT DETECTED NOT DETECTED Final   Streptococcus pneumoniae NOT DETECTED NOT DETECTED Final   Streptococcus pyogenes NOT DETECTED NOT DETECTED Final   A.calcoaceticus-baumannii NOT DETECTED NOT DETECTED Final   Bacteroides fragilis NOT DETECTED NOT DETECTED Final   Enterobacterales NOT DETECTED NOT DETECTED Final   Enterobacter cloacae complex NOT DETECTED NOT DETECTED Final   Escherichia coli NOT DETECTED NOT DETECTED Final   Klebsiella aerogenes NOT DETECTED NOT DETECTED Final   Klebsiella oxytoca NOT DETECTED NOT DETECTED Final   Klebsiella pneumoniae  NOT DETECTED NOT DETECTED Final   Proteus species NOT DETECTED NOT DETECTED Final   Salmonella species NOT DETECTED NOT DETECTED Final   Serratia marcescens NOT DETECTED NOT DETECTED Final   Haemophilus influenzae NOT DETECTED NOT DETECTED Final   Neisseria meningitidis NOT DETECTED NOT DETECTED Final   Pseudomonas aeruginosa NOT DETECTED NOT DETECTED Final   Stenotrophomonas maltophilia NOT DETECTED NOT DETECTED Final   Candida albicans NOT DETECTED NOT DETECTED Final   Candida auris NOT DETECTED NOT DETECTED Final   Candida glabrata NOT DETECTED NOT DETECTED Final   Candida krusei NOT DETECTED NOT DETECTED Final   Candida parapsilosis NOT DETECTED NOT DETECTED Final   Candida tropicalis NOT DETECTED NOT DETECTED Final   Cryptococcus neoformans/gattii NOT DETECTED NOT DETECTED Final    Comment: Performed at [facility], [address] [place], [place] [address]      Radiology Studies: CT Abdomen Pelvis W Contrast  Result Date: 09/01/2021 CLINICAL DATA:  Left-sided pain EXAM: CT ABDOMEN AND PELVIS WITH CONTRAST TECHNIQUE: Multidetector CT imaging of the abdomen and pelvis was performed using the standard protocol following bolus administration of intravenous contrast. CONTRAST:  OMNIPAQUE IOHEXOL 300 MG/ML  SOLN COMPARISON:  CT 04/13/2020 FINDINGS: Lower chest:  No acute abnormality. Hepatobiliary: No focal liver abnormality is seen. No gallstones, gallbladder wall thickening, or biliary dilatation. Pancreas: Unremarkable. No pancreatic ductal dilatation or surrounding inflammatory changes. Spleen: Normal in size without focal abnormality. Adrenals/Urinary Tract: Adrenal glands are unremarkable. Kidneys are normal, without renal calculi, focal lesion, or hydronephrosis. Bladder is unremarkable. Stomach/Bowel: The stomach is nonenlarged. No dilated small bowel. Negative appendix. Left colon diverticular disease. Focal wall thickening at the sigmoid colon. Moderate inflammatory changes in the left lower quadrant fat. Vascular/Lymphatic: Nonaneurysmal aorta.  No suspicious nodes. Reproductive: Enlarged prostate Other: Multiple small foci of extraluminal gas within the left lower quadrant and central pelvic fat slightly to the left of midline, series 3, image 64, consistent with perforation. Musculoskeletal: No acute osseous abnormality. Small fat containing umbilical hernia with mild soft tissue stranding within and around the hernia. IMPRESSION: 1. Left lower quadrant inflammatory process. Focally thickened sigmoid colon with diverticula present, findings most likely due to acute diverticulitis. Multiple foci of extraluminal gas in the left lower quadrant and central pelvis consistent with perforation. No abscess 2. Small fat containing umbilical hernia with inflammatory changes around the hernia and within the hernia sac, correlate for symptoms of incarcerated fat hernia Critical Value/emergent results were called by telephone at the time of interpretation on 09/01/2021 at 9:43 pm to provider [facility] , who verbally acknowledged these results. Electronically Signed   By: Jasmine Pang M.D.   On: 09/01/2021 21:43    Scheduled Meds:  alfuzosin  10 mg Oral Q breakfast   irbesartan  300 mg Oral Daily   And   hydrochlorothiazide  25 mg Oral Daily   [START ON  09/05/2021] levothyroxine  87.5 mcg Intravenous Daily   pantoprazole (PROTONIX) IV  40 mg Intravenous Q24H   Continuous Infusions:  lactated ringers 75 mL/hr at 09/02/21 1746   piperacillin-tazobactam (ZOSYN)  IV 3.375 g (09/03/21 1335)   promethazine (PHENERGAN) injection (IM or IVPB) 12.5 mg (09/02/21 2349)     LOS: 1 day   Rickey Barbara, MD [facility] Pager On Amion  If 7PM-7AM, please contact night-coverage 09/03/2021, 5:01 PM

## 2021-09-03 NOTE — Progress Notes (Signed)
Subjective/Chief Complaint: Feels much better, no n/v, having some flatus   Objective: Vital signs in last 24 hours: Temp:  [97.8 F (36.6 C)-99.2 F (37.3 C)] 98.2 F (36.8 C) (10/23 0452) Pulse Rate:  [84-101] 100 (10/23 0452) Resp:  [16-18] 16 (10/23 0452) BP: (122-133)/(80-89) 133/82 (10/23 0452) SpO2:  [94 %-96 %] 94 % (10/23 0452) Weight:  [106.8 kg] 106.8 kg (10/23 0457) Last BM Date: 09/02/21  Intake/Output from previous day: 10/22 0701 - 10/23 0700 In: 621.8 [I.V.:515.9; IV Piggyback:105.9] Out: 300 [Urine:300] Intake/Output this shift: No intake/output data recorded.  GI: localized llq tenderness, soft, nondistended  Lab Results:  Recent Labs    09/01/21 1956 09/02/21 0644  WBC 13.7* 11.1*  HGB 14.4 12.8*  HCT 43.1 39.7  PLT 203 173   BMET Recent Labs    09/01/21 1956 09/02/21 0644  NA 136 137  K 3.8 3.5  CL 99 105  CO2 29 28  GLUCOSE 115* 117*  BUN 11 12  CREATININE 1.16 1.24  CALCIUM 9.0 8.5*   PT/INR Recent Labs    09/02/21 0644  LABPROT 13.8  INR 1.1   ABG No results for input(s): PHART, HCO3 in the last 72 hours.  Invalid input(s): PCO2, PO2  Studies/Results: CT Abdomen Pelvis W Contrast  Result Date: 09/01/2021 CLINICAL DATA:  Left-sided pain EXAM: CT ABDOMEN AND PELVIS WITH CONTRAST TECHNIQUE: Multidetector CT imaging of the abdomen and pelvis was performed using the standard protocol following bolus administration of intravenous contrast. CONTRAST:  OMNIPAQUE IOHEXOL 300 MG/ML  SOLN COMPARISON:  CT 04/13/2020 FINDINGS: Lower chest: No acute abnormality. Hepatobiliary: No focal liver abnormality is seen. No gallstones, gallbladder wall thickening, or biliary dilatation. Pancreas: Unremarkable. No pancreatic ductal dilatation or surrounding inflammatory changes. Spleen: Normal in size without focal abnormality. Adrenals/Urinary Tract: Adrenal glands are unremarkable. Kidneys are normal, without renal calculi, focal lesion,  or hydronephrosis. Bladder is unremarkable. Stomach/Bowel: The stomach is nonenlarged. No dilated small bowel. Negative appendix. Left colon diverticular disease. Focal wall thickening at the sigmoid colon. Moderate inflammatory changes in the left lower quadrant fat. Vascular/Lymphatic: Nonaneurysmal aorta.  No suspicious nodes. Reproductive: Enlarged prostate Other: Multiple small foci of extraluminal gas within the left lower quadrant and central pelvic fat slightly to the left of midline, series 3, image 64, consistent with perforation. Musculoskeletal: No acute osseous abnormality. Small fat containing umbilical hernia with mild soft tissue stranding within and around the hernia. IMPRESSION: 1. Left lower quadrant inflammatory process. Focally thickened sigmoid colon with diverticula present, findings most likely due to acute diverticulitis. Multiple foci of extraluminal gas in the left lower quadrant and central pelvis consistent with perforation. No abscess 2. Small fat containing umbilical hernia with inflammatory changes around the hernia and within the hernia sac, correlate for symptoms of incarcerated fat hernia Critical Value/emergent results were called by telephone at the time of interpretation on 09/01/2021 at 9:43 pm to provider [facility] , who verbally acknowledged these results. Electronically Signed   By: Jasmine Pang M.D.   On: 09/01/2021 21:43    Anti-infectives: Anti-infectives (From admission, onward)    Start     Dose/Rate Route Frequency Ordered Stop   09/02/21 0600  piperacillin-tazobactam (ZOSYN) IVPB 3.375 g        3.375 g 12.5 mL/hr over 240 Minutes Intravenous Every 8 hours 09/02/21 0148     09/01/21 2145  piperacillin-tazobactam (ZOSYN) IVPB 3.375 g        3.375 g 100 mL/hr over 30 Minutes  Intravenous  Once 09/01/21 2143 09/01/21 2257       Assessment/Plan: Complicated diverticulitis -clinically better today, wbc pending -continue zosyn for at least 24 more  hours -dont think needs repeat imaging at this point  -clear liquids -hopefully continues to improve, if does will need csc after acute episode -with complicated disease might be reasonable to see colorectal surgery after colonoscopy -can restart all home meds per medicine Emelia Loron 09/03/2021

## 2021-09-04 DIAGNOSIS — E079 Disorder of thyroid, unspecified: Secondary | ICD-10-CM | POA: Diagnosis not present

## 2021-09-04 DIAGNOSIS — I10 Essential (primary) hypertension: Secondary | ICD-10-CM | POA: Diagnosis not present

## 2021-09-04 DIAGNOSIS — K5792 Diverticulitis of intestine, part unspecified, without perforation or abscess without bleeding: Secondary | ICD-10-CM | POA: Diagnosis not present

## 2021-09-04 LAB — CBC
HCT: 37.9 % — ABNORMAL LOW (ref 39.0–52.0)
Hemoglobin: 12 g/dL — ABNORMAL LOW (ref 13.0–17.0)
MCH: 27.8 pg (ref 26.0–34.0)
MCHC: 31.7 g/dL (ref 30.0–36.0)
MCV: 87.9 fL (ref 80.0–100.0)
Platelets: 179 10*3/uL (ref 150–400)
RBC: 4.31 MIL/uL (ref 4.22–5.81)
RDW: 14.5 % (ref 11.5–15.5)
WBC: 8.2 10*3/uL (ref 4.0–10.5)
nRBC: 0 % (ref 0.0–0.2)

## 2021-09-04 LAB — CULTURE, BLOOD (ROUTINE X 2): Special Requests: ADEQUATE

## 2021-09-04 LAB — COMPREHENSIVE METABOLIC PANEL
ALT: 15 U/L (ref 0–44)
AST: 13 U/L — ABNORMAL LOW (ref 15–41)
Albumin: 3 g/dL — ABNORMAL LOW (ref 3.5–5.0)
Alkaline Phosphatase: 34 U/L — ABNORMAL LOW (ref 38–126)
Anion gap: 3 — ABNORMAL LOW (ref 5–15)
BUN: 9 mg/dL (ref 8–23)
CO2: 34 mmol/L — ABNORMAL HIGH (ref 22–32)
Calcium: 8.4 mg/dL — ABNORMAL LOW (ref 8.9–10.3)
Chloride: 100 mmol/L (ref 98–111)
Creatinine, Ser: 1.31 mg/dL — ABNORMAL HIGH (ref 0.61–1.24)
GFR, Estimated: >60 mL/min (ref >60)
Glucose, Bld: 111 mg/dL — ABNORMAL HIGH (ref 70–99)
Potassium: 3.3 mmol/L — ABNORMAL LOW (ref 3.5–5.1)
Sodium: 137 mmol/L (ref 135–145)
Total Bilirubin: 1.1 mg/dL (ref 0.3–1.2)
Total Protein: 6 g/dL — ABNORMAL LOW (ref 6.5–8.1)

## 2021-09-04 MED ORDER — POTASSIUM CHLORIDE CRYS ER 20 MEQ PO TBCR
60.0000 meq | EXTENDED_RELEASE_TABLET | Freq: Once | ORAL | Status: AC
  Administered 2021-09-04: 60 meq via ORAL
  Filled 2021-09-04: qty 3

## 2021-09-04 MED ORDER — LEVOTHYROXINE SODIUM 100 MCG/5ML IV SOLN
87.5000 ug | Freq: Every day | INTRAVENOUS | Status: AC
  Administered 2021-09-04: 87.5 ug via INTRAVENOUS
  Filled 2021-09-04: qty 5

## 2021-09-04 MED ORDER — LEVOTHYROXINE SODIUM 50 MCG PO TABS
175.0000 ug | ORAL_TABLET | Freq: Every day | ORAL | Status: DC
  Administered 2021-09-05 – 2021-09-06 (×2): 175 ug via ORAL
  Filled 2021-09-04 (×2): qty 1

## 2021-09-04 MED ORDER — OXYCODONE HCL 5 MG PO TABS
5.0000 mg | ORAL_TABLET | ORAL | Status: DC | PRN
  Administered 2021-09-04 – 2021-09-06 (×10): 5 mg via ORAL
  Filled 2021-09-04 (×10): qty 1

## 2021-09-04 MED ORDER — PANTOPRAZOLE SODIUM 40 MG PO TBEC
40.0000 mg | DELAYED_RELEASE_TABLET | Freq: Every day | ORAL | Status: DC
  Administered 2021-09-05 – 2021-09-06 (×2): 40 mg via ORAL
  Filled 2021-09-04 (×2): qty 1

## 2021-09-04 NOTE — Progress Notes (Signed)
PROGRESS NOTE    Wayne Gross  WEX:[id number] DOB: 17-May-1961 DOA: 09/01/2021 PCP: Jim Like, MD    Brief Narrative:  [phone] with hx HTN, GERD presented with abd pain, found to have acute diverticulitis with microperforation. General Surgery consulted  Assessment & Plan:   Principal Problem:   Acute diverticulitis Active Problems:   Abdominal pain   Nausea   Sepsis (HCC)   Thyroid disease   GERD (gastroesophageal reflux disease)   Hypertension   BPH (benign prostatic hyperplasia)    #) sepsis due to acute diverticulitis with microperforation:  -currently continued on zosyn at this time -General Surgery following recs for cont regimen -advancing diet slowly per general surgery -Cont on clear liquids per  -Will repeat CBC in AM   #) Essential Hypertension:  -BP currently stable -cont on home bp meds as tolerated   #) Acquired hypothyroidism:  -Continued thyroid replacement as tolerated -stable   #) GERD: -cont on IV protonix currently   #) Benign prostatic hyperplasia:  -alfuzosin on hold at time of presentation  DVT prophylaxis: SCD's Code Status: Full Family Communication: Pt in room, family is currently at bedside  Status is: Inpatient  Remains inpatient appropriate because: severity of illness   Consultants:  General Surgery  Procedures:    Antimicrobials: Anti-infectives (From admission, onward)    Start     Dose/Rate Route Frequency Ordered Stop   09/02/21 0600  piperacillin-tazobactam (ZOSYN) IVPB 3.375 g        3.375 g 12.5 mL/hr over 240 Minutes Intravenous Every 8 hours 09/02/21 0148     09/01/21 2145  piperacillin-tazobactam (ZOSYN) IVPB 3.375 g        3.375 g 100 mL/hr over 30 Minutes Intravenous  Once 09/01/21 2143 09/01/21 2257       Subjective: States feeling better today. States continued lower quadrant discomfort, tolerating clear liquids  Objective: Vitals:   09/04/21 0300 09/04/21 0514 09/04/21 0515 09/04/21 0600   BP:   119/78   Pulse:   86   Resp: 15  17 16   Temp:   98.4 F (36.9 C)   TempSrc:   Oral   SpO2:   97%   Weight:  108.8 kg    Height:        Intake/Output Summary (Last 24 hours) at 09/04/2021 1713 Last data filed at 09/04/2021 09/06/2021 Gross per 24 hour  Intake 404.88 ml  Output 1400 ml  Net -995.12 ml    Filed Weights   09/01/21 1918 09/03/21 0457 09/04/21 0514  Weight: 108 kg 106.8 kg 108.8 kg    Examination: General exam: Awake, laying in bed, in nad Respiratory system: Normal respiratory effort, no wheezing Cardiovascular system: regular rate, s1, s2 Gastrointestinal system: Mildly distended, generally tender Central nervous system: CN2-12 grossly intact, strength intact Extremities: Perfused, no clubbing, mild BLE edema Skin: Normal skin turgor, no notable skin lesions seen Psychiatry: Mood normal // no visual hallucinations   Data Reviewed: I have personally reviewed following labs and imaging studies  CBC: Recent Labs  Lab 09/01/21 1956 09/02/21 0644 09/03/21 1331 09/04/21 0517  WBC 13.7* 11.1* 10.1 8.2  NEUTROABS 11.1* 8.6*  --   --   HGB 14.4 12.8* 13.2 12.0*  HCT 43.1 39.7 41.3 37.9*  MCV 83.4 86.3 86.8 87.9  PLT 203 173 185 179    Basic Metabolic Panel: Recent Labs  Lab 09/01/21 1956 09/02/21 0644 09/03/21 1331 09/04/21 0517  NA 136 137 136 137  K 3.8 3.5 3.5 3.3*  CL 99 105 100 100  CO2 29 28 30  34*  GLUCOSE 115* 117* 156* 111*  BUN 11 12 13 9   CREATININE 1.16 1.24 1.44* 1.31*  CALCIUM 9.0 8.5* 8.7* 8.4*  MG  --  1.8  1.8  --   --     GFR: Estimated Creatinine Clearance: 75.5 mL/min (A) (by C-G formula based on SCr of 1.31 mg/dL (H)). Liver Function Tests: Recent Labs  Lab 09/01/21 1956 09/02/21 0644 09/03/21 1331 09/04/21 0517  AST 25 18 17  13*  ALT 28 22 18 15   ALKPHOS 48 39 36* 34*  BILITOT 0.7 1.2 2.0* 1.1  PROT 6.9 6.0* 6.6 6.0*  ALBUMIN 3.9 3.4* 3.5 3.0*    Recent Labs  Lab 09/01/21 1956  LIPASE 26    No  results for input(s): AMMONIA in the last 168 hours. Coagulation Profile: Recent Labs  Lab 09/02/21 0644  INR 1.1    Cardiac Enzymes: No results for input(s): CKTOTAL, CKMB, CKMBINDEX, TROPONINI in the last 168 hours. BNP (last 3 results) No results for input(s): PROBNP in the last 8760 hours. HbA1C: No results for input(s): HGBA1C in the last 72 hours. CBG: No results for input(s): GLUCAP in the last 168 hours. Lipid Profile: No results for input(s): CHOL, HDL, LDLCALC, TRIG, CHOLHDL, LDLDIRECT in the last 72 hours. Thyroid Function Tests: No results for input(s): TSH, T4TOTAL, FREET4, T3FREE, THYROIDAB in the last 72 hours. Anemia Panel: No results for input(s): VITAMINB12, FOLATE, FERRITIN, TIBC, IRON, RETICCTPCT in the last 72 hours. Sepsis Labs: Recent Labs  Lab 09/02/21 0644 09/02/21 0825  LATICACIDVEN 0.9 1.1     Recent Results (from the past 240 hour(s))  Resp Panel by RT-PCR (Flu A&B, Covid) Nasopharyngeal Swab     Status: None   Collection Time: 09/01/21  9:54 PM   Specimen: Nasopharyngeal Swab; Nasopharyngeal(NP) swabs in vial transport medium  Result Value Ref Range Status   SARS Coronavirus 2 by RT PCR NEGATIVE NEGATIVE Final    Comment: (NOTE) SARS-CoV-2 target nucleic acids are NOT DETECTED.  The SARS-CoV-2 RNA is generally detectable in upper respiratory specimens during the acute phase of infection. The lowest concentration of SARS-CoV-2 viral copies this assay can detect is 138 copies/mL. A negative result does not preclude SARS-Cov-2 infection and should not be used as the sole basis for treatment or other patient management decisions. A negative result may occur with  improper specimen collection/handling, submission of specimen other than nasopharyngeal swab, presence of viral mutation(s) within the areas targeted by this assay, and inadequate number of viral copies(<138 copies/mL). A negative result must be combined with clinical observations,  patient history, and epidemiological information. The expected result is Negative.  Fact Sheet for Patients:  09/04/21  Fact Sheet for Healthcare Providers:  09/04/21  This test is no t yet approved or cleared by the 09/04/21 FDA and  has been authorized for detection and/or diagnosis of SARS-CoV-2 by FDA under an Emergency Use Authorization (EUA). This EUA will remain  in effect (meaning this test can be used) for the duration of the COVID-19 declaration under Section 564(b)(1) of the Act, 21 U.S.C.section 360bbb-3(b)(1), unless the authorization is terminated  or revoked sooner.       Influenza A by PCR NEGATIVE NEGATIVE Final   Influenza B by PCR NEGATIVE NEGATIVE Final    Comment: (NOTE) The Xpert Xpress SARS-CoV-2/FLU/RSV plus assay is intended as an aid in the diagnosis of influenza from Nasopharyngeal swab specimens and should not be used  as a sole basis for treatment. Nasal washings and aspirates are unacceptable for Xpert Xpress SARS-CoV-2/FLU/RSV testing.  Fact Sheet for Patients: BloggerCourse.com  Fact Sheet for Healthcare Providers: SeriousBroker.it  This test is not yet approved or cleared by the [place] FDA and has been authorized for detection and/or diagnosis of SARS-CoV-2 by FDA under an Emergency Use Authorization (EUA). This EUA will remain in effect (meaning this test can be used) for the duration of the COVID-19 declaration under Section 564(b)(1) of the Act, 21 U.S.C. section 360bbb-3(b)(1), unless the authorization is terminated or revoked.  Performed at [facility], [address] [place], [place] [address]   Culture, blood (Routine X 2) w Reflex to ID Panel     Status: Abnormal   Collection Time: 09/02/21  6:44 AM   Specimen: BLOOD  Result Value Ref Range Status   Specimen Description   Final    BLOOD  RIGHT ANTECUBITAL Performed at [facility], [address] [place], [place] [address]    Special Requests   Final    BOTTLES DRAWN AEROBIC AND ANAEROBIC Blood Culture adequate volume Performed at [facility], [address], [place], [place] [address]    Culture  Setup Time   Final    GRAM POSITIVE COCCI AEROBIC BOTTLE ONLY CRITICAL RESULT CALLED TO, READ BACK BY AND VERIFIED WITH: M,JAMES PHARMD @0732  09/03/21 EB    Culture (A)  Final    STAPHYLOCOCCUS HOMINIS THE SIGNIFICANCE OF ISOLATING THIS ORGANISM FROM A SINGLE SET OF BLOOD CULTURES WHEN MULTIPLE SETS ARE DRAWN IS UNCERTAIN. PLEASE NOTIFY THE MICROBIOLOGY DEPARTMENT WITHIN ONE WEEK IF SPECIATION AND SENSITIVITIES ARE REQUIRED. Performed at [facility], [address], [place], [place]    Report Status 09/04/2021 FINAL  Final  Culture, blood (Routine X 2) w Reflex to ID Panel     Status: None (Preliminary result)   Collection Time: 09/02/21  6:44 AM   Specimen: BLOOD  Result Value Ref Range Status   Specimen Description   Final    BLOOD BLOOD RIGHT FOREARM Performed at [facility], [address], [place], [place]    Special Requests   Final    BOTTLES DRAWN AEROBIC ONLY Blood Culture results may not be optimal due to an inadequate volume of blood received in culture bottles Performed at [facility], [address] [place], [place]    Culture   Final    NO GROWTH 2 DAYS Performed at [facility], [address], [place], [place]    Report Status PENDING  Incomplete  Blood Culture ID Panel (Reflexed)     Status: Abnormal   Collection Time: 09/02/21  6:44 AM  Result Value Ref Range Status   Enterococcus faecalis NOT DETECTED NOT DETECTED Final   Enterococcus Faecium NOT DETECTED NOT DETECTED Final   Listeria monocytogenes NOT DETECTED NOT DETECTED Final   Staphylococcus species DETECTED  (A) NOT DETECTED Final    Comment: CRITICAL RESULT CALLED TO, READ BACK BY AND VERIFIED WITH: M,JAMES PHARMD @0732  09/03/21 EB    Staphylococcus aureus (BCID) NOT DETECTED NOT DETECTED Final   Staphylococcus epidermidis NOT DETECTED NOT DETECTED Final   Staphylococcus lugdunensis NOT DETECTED NOT DETECTED Final   Streptococcus species NOT DETECTED NOT DETECTED Final   Streptococcus agalactiae NOT DETECTED NOT DETECTED Final   Streptococcus pneumoniae NOT DETECTED NOT DETECTED Final   Streptococcus pyogenes NOT DETECTED  NOT DETECTED Final   A.calcoaceticus-baumannii NOT DETECTED NOT DETECTED Final   Bacteroides fragilis NOT DETECTED NOT DETECTED Final   Enterobacterales NOT DETECTED NOT DETECTED Final   Enterobacter cloacae complex NOT DETECTED NOT DETECTED Final   Escherichia coli NOT DETECTED NOT DETECTED Final   Klebsiella aerogenes NOT DETECTED NOT DETECTED Final   Klebsiella oxytoca NOT DETECTED NOT DETECTED Final   Klebsiella pneumoniae NOT DETECTED NOT DETECTED Final   Proteus species NOT DETECTED NOT DETECTED Final   Salmonella species NOT DETECTED NOT DETECTED Final   Serratia marcescens NOT DETECTED NOT DETECTED Final   Haemophilus influenzae NOT DETECTED NOT DETECTED Final   Neisseria meningitidis NOT DETECTED NOT DETECTED Final   Pseudomonas aeruginosa NOT DETECTED NOT DETECTED Final   Stenotrophomonas maltophilia NOT DETECTED NOT DETECTED Final   Candida albicans NOT DETECTED NOT DETECTED Final   Candida auris NOT DETECTED NOT DETECTED Final   Candida glabrata NOT DETECTED NOT DETECTED Final   Candida krusei NOT DETECTED NOT DETECTED Final   Candida parapsilosis NOT DETECTED NOT DETECTED Final   Candida tropicalis NOT DETECTED NOT DETECTED Final   Cryptococcus neoformans/gattii NOT DETECTED NOT DETECTED Final    Comment: Performed at [facility], [address], [place], [place] [address]      Radiology Studies: No results found.  Scheduled Meds:  alfuzosin   10 mg Oral Q breakfast   irbesartan  300 mg Oral Daily   And   hydrochlorothiazide  25 mg Oral Daily   [START ON 09/05/2021] levothyroxine  175 mcg Oral Q0600   [START ON 09/05/2021] pantoprazole  40 mg Oral Daily   Continuous Infusions:  piperacillin-tazobactam (ZOSYN)  IV 3.375 g (09/04/21 1429)   promethazine (PHENERGAN) injection (IM or IVPB) 12.5 mg (09/03/21 1806)     LOS: 2 days   Rickey Barbara, MD [facility] Pager On Amion  If 7PM-7AM, please contact night-coverage 09/04/2021, 5:13 PM

## 2021-09-04 NOTE — Progress Notes (Addendum)
   Subjective/Chief Complaint: Feeling better yesterday morning, but had a bad night with some increase in pain.  Feels a little better this morning.  No nausea.  Having some flatus.  Tolerating clears.  Some c/o trying to get stream going with urination   Objective: Vital signs in last 24 hours: Temp:  [98.4 F (36.9 C)-100.1 F (37.8 C)] 98.4 F (36.9 C) (10/24 0515) Pulse Rate:  [85-92] 86 (10/24 0515) Resp:  [13-19] 16 (10/24 0600) BP: (113-119)/(70-83) 119/78 (10/24 0515) SpO2:  [95 %-99 %] 97 % (10/24 0515) Weight:  [108.8 kg] 108.8 kg (10/24 0514) Last BM Date: 09/03/21  Intake/Output from previous day: 10/23 0701 - 10/24 0700 In: 884.9 [P.O.:481; I.V.:403.9] Out: 1950 [Urine:1950] Intake/Output this shift: Total I/O In: -  Out: 200 [Urine:200]  PE: Heart: regular Lungs; CTAB Abd: soft, but with some mild distention, tender to palpation in LLQ and some suprapubic with some focal guarding in LLQ, +BS, no peritonitis.  Small reducible umbilical hernia containing only fat  Lab Results:  Recent Labs    09/03/21 1331 09/04/21 0517  WBC 10.1 8.2  HGB 13.2 12.0*  HCT 41.3 37.9*  PLT 185 179   BMET Recent Labs    09/03/21 1331 09/04/21 0517  NA 136 137  K 3.5 3.3*  CL 100 100  CO2 30 34*  GLUCOSE 156* 111*  BUN 13 9  CREATININE 1.44* 1.31*  CALCIUM 8.7* 8.4*   PT/INR Recent Labs    09/02/21 0644  LABPROT 13.8  INR 1.1   ABG No results for input(s): PHART, HCO3 in the last 72 hours.  Invalid input(s): PCO2, PO2  Studies/Results: No results found.  Anti-infectives: Anti-infectives (From admission, onward)    Start     Dose/Rate Route Frequency Ordered Stop   09/02/21 0600  piperacillin-tazobactam (ZOSYN) IVPB 3.375 g        3.375 g 12.5 mL/hr over 240 Minutes Intravenous Every 8 hours 09/02/21 0148     09/01/21 2145  piperacillin-tazobactam (ZOSYN) IVPB 3.375 g        3.375 g 100 mL/hr over 30 Minutes Intravenous  Once 09/01/21 2143  09/01/21 2257       Assessment/Plan: Complicated diverticulitis -clinically stable today, no better no worse, wbc improving, but still with a very tender abdomen in LLQ -continue zosyn  -dont think needs repeat imaging at this point, but will continue on CLD today and see how he does.   -hopefully continues to improve, if does will need csc after acute episode -with complicated disease might be reasonable to see colorectal surgery after colonoscopy -can restart all home meds per medicine  FEN - CLD VTE - ok for prophylaxis from surgical standpoint ID - zosyn   Letha Cape 09/04/2021

## 2021-09-05 DIAGNOSIS — I10 Essential (primary) hypertension: Secondary | ICD-10-CM | POA: Diagnosis not present

## 2021-09-05 DIAGNOSIS — K5792 Diverticulitis of intestine, part unspecified, without perforation or abscess without bleeding: Secondary | ICD-10-CM | POA: Diagnosis not present

## 2021-09-05 LAB — CBC
HCT: 37.1 % — ABNORMAL LOW (ref 39.0–52.0)
Hemoglobin: 12.2 g/dL — ABNORMAL LOW (ref 13.0–17.0)
MCH: 28.4 pg (ref 26.0–34.0)
MCHC: 32.9 g/dL (ref 30.0–36.0)
MCV: 86.5 fL (ref 80.0–100.0)
Platelets: 238 10*3/uL (ref 150–400)
RBC: 4.29 MIL/uL (ref 4.22–5.81)
RDW: 14.6 % (ref 11.5–15.5)
WBC: 6.8 10*3/uL (ref 4.0–10.5)
nRBC: 0 % (ref 0.0–0.2)

## 2021-09-05 LAB — COMPREHENSIVE METABOLIC PANEL
ALT: 16 U/L (ref 0–44)
AST: 14 U/L — ABNORMAL LOW (ref 15–41)
Albumin: 3.2 g/dL — ABNORMAL LOW (ref 3.5–5.0)
Alkaline Phosphatase: 34 U/L — ABNORMAL LOW (ref 38–126)
Anion gap: 7 (ref 5–15)
BUN: 8 mg/dL (ref 8–23)
CO2: 30 mmol/L (ref 22–32)
Calcium: 8.7 mg/dL — ABNORMAL LOW (ref 8.9–10.3)
Chloride: 102 mmol/L (ref 98–111)
Creatinine, Ser: 1.02 mg/dL (ref 0.61–1.24)
GFR, Estimated: >60 mL/min (ref >60)
Glucose, Bld: 108 mg/dL — ABNORMAL HIGH (ref 70–99)
Potassium: 3.5 mmol/L (ref 3.5–5.1)
Sodium: 139 mmol/L (ref 135–145)
Total Bilirubin: 0.5 mg/dL (ref 0.3–1.2)
Total Protein: 6.6 g/dL (ref 6.5–8.1)

## 2021-09-05 NOTE — Progress Notes (Signed)
PROGRESS NOTE    Wayne Gross  OAC:[id number] DOB: 24-Jul-1961 DOA: 09/01/2021 PCP: Jim Like, MD    Brief Narrative:  [phone] with hx HTN, GERD presented with abd pain, found to have acute diverticulitis with microperforation. General Surgery consulted  Assessment & Plan:   Principal Problem:   Acute diverticulitis Active Problems:   Abdominal pain   Nausea   Sepsis (HCC)   Thyroid disease   GERD (gastroesophageal reflux disease)   Hypertension   BPH (benign prostatic hyperplasia)    #) sepsis due to acute diverticulitis with microperforation:  -currently continued on zosyn -General Surgery following -advancing diet slowly per general surgery, to soft diet today -Will repeat CBC in AM   #) Essential Hypertension:  -BP currently stable -cont on home meds as tolerated   #) Acquired hypothyroidism:  -Continued thyroid replacement as tolerated -Stable   #) GERD: -cont on IV protonix currently   #) Benign prostatic hyperplasia:  -alfuzosin on hold at time of presentation  DVT prophylaxis: SCD's Code Status: Full Family Communication: Pt in room, family not at bedside  Status is: Inpatient  Remains inpatient appropriate because: severity of illness   Consultants:  General Surgery  Procedures:    Antimicrobials: Anti-infectives (From admission, onward)    Start     Dose/Rate Route Frequency Ordered Stop   09/02/21 0600  piperacillin-tazobactam (ZOSYN) IVPB 3.375 g        3.375 g 12.5 mL/hr over 240 Minutes Intravenous Every 8 hours 09/02/21 0148     09/01/21 2145  piperacillin-tazobactam (ZOSYN) IVPB 3.375 g        3.375 g 100 mL/hr over 30 Minutes Intravenous  Once 09/01/21 2143 09/01/21 2257       Subjective: Reports feeling better. Passing flatus and stools  Objective: Vitals:   09/04/21 2206 09/05/21 0230 09/05/21 0624 09/05/21 1445  BP:   119/85 120/81  Pulse:   80 81  Resp: 17 16 18 16   Temp:   98.3 F (36.8 C) 97.9 F (36.6  C)  TempSrc:   Oral Oral  SpO2:   92% 97%  Weight:      Height:        Intake/Output Summary (Last 24 hours) at 09/05/2021 1507 Last data filed at 09/05/2021 1000 Gross per 24 hour  Intake 480 ml  Output 700 ml  Net -220 ml    Filed Weights   09/01/21 1918 09/03/21 0457 09/04/21 0514  Weight: 108 kg 106.8 kg 108.8 kg    Examination: General exam: Conversant, in no acute distress Respiratory system: normal chest rise, clear, no audible wheezing Cardiovascular system: regular rhythm, s1-s2 Gastrointestinal system: Less distended, obese Central nervous system: No seizures, no tremors Extremities: No cyanosis, no joint deformities Skin: No rashes, no pallor Psychiatry: Affect normal // no auditory hallucinations   Data Reviewed: I have personally reviewed following labs and imaging studies  CBC: Recent Labs  Lab 09/01/21 1956 09/02/21 0644 09/03/21 1331 09/04/21 0517 09/05/21 0634  WBC 13.7* 11.1* 10.1 8.2 6.8  NEUTROABS 11.1* 8.6*  --   --   --   HGB 14.4 12.8* 13.2 12.0* 12.2*  HCT 43.1 39.7 41.3 37.9* 37.1*  MCV 83.4 86.3 86.8 87.9 86.5  PLT 203 173 185 179 238    Basic Metabolic Panel: Recent Labs  Lab 09/01/21 1956 09/02/21 0644 09/03/21 1331 09/04/21 0517 09/05/21 0634  NA 136 137 136 137 139  K 3.8 3.5 3.5 3.3* 3.5  CL 99 105 100 100 102  CO2 29 28 30  34* 30  GLUCOSE 115* 117* 156* 111* 108*  BUN 11 12 13 9 8   CREATININE 1.16 1.24 1.44* 1.31* 1.02  CALCIUM 9.0 8.5* 8.7* 8.4* 8.7*  MG  --  1.8  1.8  --   --   --     GFR: Estimated Creatinine Clearance: 96.9 mL/min (by C-G formula based on SCr of 1.02 mg/dL). Liver Function Tests: Recent Labs  Lab 09/01/21 1956 09/02/21 0644 09/03/21 1331 09/04/21 0517 09/05/21 0634  AST 25 18 17  13* 14*  ALT 28 22 18 15 16   ALKPHOS 48 39 36* 34* 34*  BILITOT 0.7 1.2 2.0* 1.1 0.5  PROT 6.9 6.0* 6.6 6.0* 6.6  ALBUMIN 3.9 3.4* 3.5 3.0* 3.2*    Recent Labs  Lab 09/01/21 1956  LIPASE 26    No  results for input(s): AMMONIA in the last 168 hours. Coagulation Profile: Recent Labs  Lab 09/02/21 0644  INR 1.1    Cardiac Enzymes: No results for input(s): CKTOTAL, CKMB, CKMBINDEX, TROPONINI in the last 168 hours. BNP (last 3 results) No results for input(s): PROBNP in the last 8760 hours. HbA1C: No results for input(s): HGBA1C in the last 72 hours. CBG: No results for input(s): GLUCAP in the last 168 hours. Lipid Profile: No results for input(s): CHOL, HDL, LDLCALC, TRIG, CHOLHDL, LDLDIRECT in the last 72 hours. Thyroid Function Tests: No results for input(s): TSH, T4TOTAL, FREET4, T3FREE, THYROIDAB in the last 72 hours. Anemia Panel: No results for input(s): VITAMINB12, FOLATE, FERRITIN, TIBC, IRON, RETICCTPCT in the last 72 hours. Sepsis Labs: Recent Labs  Lab 09/02/21 0644 09/02/21 0825  LATICACIDVEN 0.9 1.1     Recent Results (from the past 240 hour(s))  Resp Panel by RT-PCR (Flu A&B, Covid) Nasopharyngeal Swab     Status: None   Collection Time: 09/01/21  9:54 PM   Specimen: Nasopharyngeal Swab; Nasopharyngeal(NP) swabs in vial transport medium  Result Value Ref Range Status   SARS Coronavirus 2 by RT PCR NEGATIVE NEGATIVE Final    Comment: (NOTE) SARS-CoV-2 target nucleic acids are NOT DETECTED.  The SARS-CoV-2 RNA is generally detectable in upper respiratory specimens during the acute phase of infection. The lowest concentration of SARS-CoV-2 viral copies this assay can detect is 138 copies/mL. A negative result does not preclude SARS-Cov-2 infection and should not be used as the sole basis for treatment or other patient management decisions. A negative result may occur with  improper specimen collection/handling, submission of specimen other than nasopharyngeal swab, presence of viral mutation(s) within the areas targeted by this assay, and inadequate number of viral copies(<138 copies/mL). A negative result must be combined with clinical observations,  patient history, and epidemiological information. The expected result is Negative.  Fact Sheet for Patients:  09/04/21  Fact Sheet for Healthcare Providers:  09/04/21  This test is no t yet approved or cleared by the 09/04/21 FDA and  has been authorized for detection and/or diagnosis of SARS-CoV-2 by FDA under an Emergency Use Authorization (EUA). This EUA will remain  in effect (meaning this test can be used) for the duration of the COVID-19 declaration under Section 564(b)(1) of the Act, 21 U.S.C.section 360bbb-3(b)(1), unless the authorization is terminated  or revoked sooner.       Influenza A by PCR NEGATIVE NEGATIVE Final   Influenza B by PCR NEGATIVE NEGATIVE Final    Comment: (NOTE) The Xpert Xpress SARS-CoV-2/FLU/RSV plus assay is intended as an aid in the diagnosis of influenza from  Nasopharyngeal swab specimens and should not be used as a sole basis for treatment. Nasal washings and aspirates are unacceptable for Xpert Xpress SARS-CoV-2/FLU/RSV testing.  Fact Sheet for Patients: BloggerCourse.com  Fact Sheet for Healthcare Providers: SeriousBroker.it  This test is not yet approved or cleared by the [place] FDA and has been authorized for detection and/or diagnosis of SARS-CoV-2 by FDA under an Emergency Use Authorization (EUA). This EUA will remain in effect (meaning this test can be used) for the duration of the COVID-19 declaration under Section 564(b)(1) of the Act, 21 U.S.C. section 360bbb-3(b)(1), unless the authorization is terminated or revoked.  Performed at [facility], [address] [place], [place] [address]   Culture, blood (Routine X 2) w Reflex to ID Panel     Status: Abnormal   Collection Time: 09/02/21  6:44 AM   Specimen: BLOOD  Result Value Ref Range Status   Specimen Description   Final    BLOOD  RIGHT ANTECUBITAL Performed at [facility], [address] [place], [place] [address]    Special Requests   Final    BOTTLES DRAWN AEROBIC AND ANAEROBIC Blood Culture adequate volume Performed at [facility], [address], [place], [place] [address]    Culture  Setup Time   Final    GRAM POSITIVE COCCI AEROBIC BOTTLE ONLY CRITICAL RESULT CALLED TO, READ BACK BY AND VERIFIED WITH: M,JAMES PHARMD @0732  09/03/21 EB    Culture (A)  Final    STAPHYLOCOCCUS HOMINIS THE SIGNIFICANCE OF ISOLATING THIS ORGANISM FROM A SINGLE SET OF BLOOD CULTURES WHEN MULTIPLE SETS ARE DRAWN IS UNCERTAIN. PLEASE NOTIFY THE MICROBIOLOGY DEPARTMENT WITHIN ONE WEEK IF SPECIATION AND SENSITIVITIES ARE REQUIRED. Performed at [facility], [address] [place], [place]    Report Status 09/04/2021 FINAL  Final  Culture, blood (Routine X 2) w Reflex to ID Panel     Status: None (Preliminary result)   Collection Time: 09/02/21  6:44 AM   Specimen: BLOOD  Result Value Ref Range Status   Specimen Description   Final    BLOOD BLOOD RIGHT FOREARM Performed at [facility], [address] [place], [place]    Special Requests   Final    BOTTLES DRAWN AEROBIC ONLY Blood Culture results may not be optimal due to an inadequate volume of blood received in culture bottles Performed at [facility], [address] [place], [place]    Culture   Final    NO GROWTH 3 DAYS Performed at [facility], [address] [place] [address], [place], [place]    Report Status PENDING  Incomplete  Blood Culture ID Panel (Reflexed)     Status: Abnormal   Collection Time: 09/02/21  6:44 AM  Result Value Ref Range Status   Enterococcus faecalis NOT DETECTED NOT DETECTED Final   Enterococcus Faecium NOT DETECTED NOT DETECTED Final   Listeria monocytogenes NOT DETECTED NOT DETECTED Final   Staphylococcus species DETECTED  (A) NOT DETECTED Final    Comment: CRITICAL RESULT CALLED TO, READ BACK BY AND VERIFIED WITH: M,JAMES PHARMD @0732  09/03/21 EB    Staphylococcus aureus (BCID) NOT DETECTED NOT DETECTED Final   Staphylococcus epidermidis NOT DETECTED NOT DETECTED Final   Staphylococcus lugdunensis NOT DETECTED NOT DETECTED Final   Streptococcus species NOT DETECTED NOT DETECTED Final   Streptococcus agalactiae NOT DETECTED NOT DETECTED Final   Streptococcus pneumoniae NOT DETECTED NOT  DETECTED Final   Streptococcus pyogenes NOT DETECTED NOT DETECTED Final   A.calcoaceticus-baumannii NOT DETECTED NOT DETECTED Final   Bacteroides fragilis NOT DETECTED NOT DETECTED Final   Enterobacterales NOT DETECTED NOT DETECTED Final   Enterobacter cloacae complex NOT DETECTED NOT DETECTED Final   Escherichia coli NOT DETECTED NOT DETECTED Final   Klebsiella aerogenes NOT DETECTED NOT DETECTED Final   Klebsiella oxytoca NOT DETECTED NOT DETECTED Final   Klebsiella pneumoniae NOT DETECTED NOT DETECTED Final   Proteus species NOT DETECTED NOT DETECTED Final   Salmonella species NOT DETECTED NOT DETECTED Final   Serratia marcescens NOT DETECTED NOT DETECTED Final   Haemophilus influenzae NOT DETECTED NOT DETECTED Final   Neisseria meningitidis NOT DETECTED NOT DETECTED Final   Pseudomonas aeruginosa NOT DETECTED NOT DETECTED Final   Stenotrophomonas maltophilia NOT DETECTED NOT DETECTED Final   Candida albicans NOT DETECTED NOT DETECTED Final   Candida auris NOT DETECTED NOT DETECTED Final   Candida glabrata NOT DETECTED NOT DETECTED Final   Candida krusei NOT DETECTED NOT DETECTED Final   Candida parapsilosis NOT DETECTED NOT DETECTED Final   Candida tropicalis NOT DETECTED NOT DETECTED Final   Cryptococcus neoformans/gattii NOT DETECTED NOT DETECTED Final    Comment: Performed at [facility], [address], [place], [place] [address]      Radiology Studies: No results found.  Scheduled Meds:  alfuzosin   10 mg Oral Q breakfast   irbesartan  300 mg Oral Daily   And   hydrochlorothiazide  25 mg Oral Daily   levothyroxine  175 mcg Oral Q0600   pantoprazole  40 mg Oral Daily   Continuous Infusions:  piperacillin-tazobactam (ZOSYN)  IV 3.375 g (09/05/21 1351)   promethazine (PHENERGAN) injection (IM or IVPB) 12.5 mg (09/03/21 1806)     LOS: 3 days   Rickey Barbara, MD [facility] Pager On Amion  If 7PM-7AM, please contact night-coverage 09/05/2021, 3:07 PM

## 2021-09-05 NOTE — Progress Notes (Signed)
   Subjective/Chief Complaint: Significant improvement in pain today. Having BM and passing flatus. Ambulating. Tolerating FLD  Wife is bedside  Objective: Vital signs in last 24 hours: Temp:  [97.7 F (36.5 C)-98.8 F (37.1 C)] 98.3 F (36.8 C) (10/25 0624) Pulse Rate:  [80-90] 80 (10/25 0624) Resp:  [16-18] 18 (10/25 0624) BP: (119-126)/(80-85) 119/85 (10/25 0624) SpO2:  [92 %-95 %] 92 % (10/25 0624) Last BM Date: 09/03/21  Intake/Output from previous day: 10/24 0701 - 10/25 0700 In: 1200 [P.O.:1200] Out: 1200 [Urine:1200] Intake/Output this shift: No intake/output data recorded.  PE: Heart: regular Lungs; CTAB Abd: soft, minimal distension, mildly tender to palpation in LLQ and without guarding or rebound, +BS.  Small reducible umbilical hernia containing only fat  Lab Results:  Recent Labs    09/04/21 0517 09/05/21 0634  WBC 8.2 6.8  HGB 12.0* 12.2*  HCT 37.9* 37.1*  PLT 179 238    BMET Recent Labs    09/04/21 0517 09/05/21 0634  NA 137 139  K 3.3* 3.5  CL 100 102  CO2 34* 30  GLUCOSE 111* 108*  BUN 9 8  CREATININE 1.31* 1.02  CALCIUM 8.4* 8.7*    PT/INR No results for input(s): LABPROT, INR in the last 72 hours.  ABG No results for input(s): PHART, HCO3 in the last 72 hours.  Invalid input(s): PCO2, PO2  Studies/Results: No results found.  Anti-infectives: Anti-infectives (From admission, onward)    Start     Dose/Rate Route Frequency Ordered Stop   09/02/21 0600  piperacillin-tazobactam (ZOSYN) IVPB 3.375 g        3.375 g 12.5 mL/hr over 240 Minutes Intravenous Every 8 hours 09/02/21 0148     09/01/21 2145  piperacillin-tazobactam (ZOSYN) IVPB 3.375 g        3.375 g 100 mL/hr over 30 Minutes Intravenous  Once 09/01/21 2143 09/01/21 2257       Assessment/Plan: Complicated diverticulitis - pain significantly improved, good bowel function, WBC normal. Tolerating FLD -continue zosyn  - soft diet today -with complicated disease  might be reasonable to see colorectal surgery after colonoscopy -can restart all home meds per medicine  FEN - soft VTE - ok for prophylaxis from surgical standpoint ID - zosyn   Eric Form 09/05/2021

## 2021-09-06 DIAGNOSIS — K5780 Diverticulitis of intestine, part unspecified, with perforation and abscess without bleeding: Secondary | ICD-10-CM

## 2021-09-06 LAB — COMPREHENSIVE METABOLIC PANEL
ALT: 14 U/L (ref 0–44)
AST: 16 U/L (ref 15–41)
Albumin: 3.2 g/dL — ABNORMAL LOW (ref 3.5–5.0)
Alkaline Phosphatase: 34 U/L — ABNORMAL LOW (ref 38–126)
Anion gap: 7 (ref 5–15)
BUN: 8 mg/dL (ref 8–23)
CO2: 30 mmol/L (ref 22–32)
Calcium: 8.9 mg/dL (ref 8.9–10.3)
Chloride: 99 mmol/L (ref 98–111)
Creatinine, Ser: 1.2 mg/dL (ref 0.61–1.24)
GFR, Estimated: >60 mL/min (ref >60)
Glucose, Bld: 103 mg/dL — ABNORMAL HIGH (ref 70–99)
Potassium: 3.4 mmol/L — ABNORMAL LOW (ref 3.5–5.1)
Sodium: 136 mmol/L (ref 135–145)
Total Bilirubin: 0.4 mg/dL (ref 0.3–1.2)
Total Protein: 6.5 g/dL (ref 6.5–8.1)

## 2021-09-06 LAB — CBC
HCT: 38.1 % — ABNORMAL LOW (ref 39.0–52.0)
Hemoglobin: 12.3 g/dL — ABNORMAL LOW (ref 13.0–17.0)
MCH: 28.1 pg (ref 26.0–34.0)
MCHC: 32.3 g/dL (ref 30.0–36.0)
MCV: 87 fL (ref 80.0–100.0)
Platelets: 250 10*3/uL (ref 150–400)
RBC: 4.38 MIL/uL (ref 4.22–5.81)
RDW: 14.5 % (ref 11.5–15.5)
WBC: 5.9 10*3/uL (ref 4.0–10.5)
nRBC: 0 % (ref 0.0–0.2)

## 2021-09-06 MED ORDER — AMOXICILLIN-POT CLAVULANATE 875-125 MG PO TABS: 1.0000 | ORAL_TABLET | Freq: Two times a day (BID) | ORAL | 0 refills | Status: AC

## 2021-09-06 MED ORDER — POTASSIUM CHLORIDE CRYS ER 20 MEQ PO TBCR
40.0000 meq | EXTENDED_RELEASE_TABLET | ORAL | Status: AC
Start: 2021-09-06 — End: 2021-09-06
  Administered 2021-09-06 (×2): 40 meq via ORAL
  Filled 2021-09-06 (×2): qty 2

## 2021-09-06 MED ORDER — OXYCODONE HCL 5 MG PO TABS: 5.0000 mg | ORAL_TABLET | ORAL | 0 refills | Status: DC | PRN

## 2021-09-06 MED ORDER — ACETAMINOPHEN 325 MG PO TABS: 650.0000 mg | ORAL_TABLET | Freq: Four times a day (QID) | ORAL | Status: DC | PRN

## 2021-09-06 NOTE — Discharge Instructions (Signed)
Per surgical team... recommend low fiber diet for 6-8 weeks, colonoscopy to be scheduled in 6-8 weeks

## 2021-09-06 NOTE — Progress Notes (Signed)
Progress Note     Subjective: Patient reports minimal pain in LLQ. He is tolerating low fiber diet and denies n/v. He is having bowel movements. He understands need for colonoscopy in 6-8 weeks.   Objective: Vital signs in last 24 hours: Temp:  [97.6 F (36.4 C)-97.9 F (36.6 C)] 97.6 F (36.4 C) (10/26 0404) Pulse Rate:  [69-81] 69 (10/26 0404) Resp:  [12-16] 12 (10/26 0404) BP: (117-120)/(74-83) 117/83 (10/26 0404) SpO2:  [91 %-97 %] 91 % (10/26 0404) Weight:  [106.6 kg] 106.6 kg (10/26 0602) Last BM Date: 09/05/21  Intake/Output from previous day: 10/25 0701 - 10/26 0700 In: 792.1 [P.O.:240; IV Piggyback:552.1] Out: 300 [Urine:300] Intake/Output this shift: No intake/output data recorded.  PE: General: pleasant, WD, obese male who is laying in bed in NAD Heart: regular, rate, and rhythm.  Palpable radial pulses bilaterally Lungs: Respiratory effort nonlabored Abd: soft, NT, ND MS: all 4 extremities are symmetrical with no cyanosis, clubbing, or edema. Skin: warm and dry with no masses, lesions, or rashes Neuro: Cranial nerves 2-12 grossly intact, sensation is normal throughout Psych: A&Ox3 with an appropriate affect.    Lab Results:  Recent Labs    09/05/21 0634 09/06/21 0528  WBC 6.8 5.9  HGB 12.2* 12.3*  HCT 37.1* 38.1*  PLT 238 250   BMET Recent Labs    09/05/21 0634 09/06/21 0528  NA 139 136  K 3.5 3.4*  CL 102 99  CO2 30 30  GLUCOSE 108* 103*  BUN 8 8  CREATININE 1.02 1.20  CALCIUM 8.7* 8.9   PT/INR No results for input(s): LABPROT, INR in the last 72 hours. CMP     Component Value Date/Time   NA 136 09/06/2021 0528   K 3.4 (L) 09/06/2021 0528   CL 99 09/06/2021 0528   CO2 30 09/06/2021 0528   GLUCOSE 103 (H) 09/06/2021 0528   BUN 8 09/06/2021 0528   CREATININE 1.20 09/06/2021 0528   CREATININE 1.07 04/12/2020 1839   CALCIUM 8.9 09/06/2021 0528   PROT 6.5 09/06/2021 0528   ALBUMIN 3.2 (L) 09/06/2021 0528   AST 16 09/06/2021 0528    ALT 14 09/06/2021 0528   ALKPHOS 34 (L) 09/06/2021 0528   BILITOT 0.4 09/06/2021 0528   GFRNONAA >60 09/06/2021 0528   GFRNONAA 76 04/12/2020 1839   GFRAA 88 04/12/2020 1839   Lipase     Component Value Date/Time   LIPASE 26 09/01/2021 1956       Studies/Results: No results found.  Anti-infectives: Anti-infectives (From admission, onward)    Start     Dose/Rate Route Frequency Ordered Stop   09/02/21 0600  piperacillin-tazobactam (ZOSYN) IVPB 3.375 g        3.375 g 12.5 mL/hr over 240 Minutes Intravenous Every 8 hours 09/02/21 0148     09/01/21 2145  piperacillin-tazobactam (ZOSYN) IVPB 3.375 g        3.375 g 100 mL/hr over 30 Minutes Intravenous  Once 09/01/21 2143 09/01/21 2257        Assessment/Plan Sigmoid diverticulitis with microperforation  - CT 10/21 with above, no abscess formation  - leukocytosis is resolved and pt is afebrile - pain improving overall and patient is tolerating low fiber diet - recommend low fiber diet x6-8 weeks. Recommend colonoscopy in 6-8 weeks and then follow up with colorectal surgery after colonoscopy. Recommend PO augmentin on discharge to total 14 day course of abx - pt is stable for discharge from a surgical perspective Reducible umbilical hernia -  OP follow up    FEN - low fiber VTE - ok for prophylaxis from surgical standpoint ID - zosyn 10/21>> will need 8 days PO augmentin on discharge   HTN GERD Hypothyroidism   LOS: 4 days    Wayne Gross, [facility] 09/06/2021, 9:30 AM Please see Amion for pager number during day hours 7:00am-4:30pm

## 2021-09-06 NOTE — Discharge Summary (Signed)
Physician Discharge Summary  Wayne Gross IDP:[id number] DOB: July 29, 1961 DOA: 09/01/2021  PCP: Jim Like, MD  Admit date: 09/01/2021 Discharge date: 09/06/2021  Admitted From: home  Disposition:  home   Recommendations for Outpatient Follow-up:  F/u with surgery  Home Health: None Discharge Condition: Stable CODE STATUS: Full code Diet recommendation: Heart healthy low fiber diet Consultations:  General surgery Procedures/Studies: None   Discharge Diagnoses:  Principal Problem:   Acute diverticulitis-  Perforated diverticulum Active Problems:   Sepsis (HCC)   Thyroid disease   GERD (gastroesophageal reflux disease)   Hypertension   BPH (benign prostatic hyperplasia)      Brief Summary: This is a 60 year old male with hypertension, hypothyroidism, GERD, BPH who presented to the ED with complaint of left lower abdominal pain for 2 to 3 days. This was associated with nausea.  In the ED CT abdomen pelvis with contrast revealed left lower quadrant inflammation, focally thickened sigmoid colon with diverticula most consistent with acute diverticulitis with multiple foci of extraluminal gas in the left lower quadrant and central pelvis consistent with microperforation in the absence of evidence of bowel abscess or obstruction. The patient was started on IV fluids IV antibiotics General surgery was consulted.  Hospital Course:  Acute diverticulitis with microperforation and Sepsis  - WBC count 13.7, respiratory rate in the 20s when admitted, temperature was 100.1 - Treated with Zosyn, IV fluids and bowel rest - His pain has improved and diet has been advanced - He will be discharged home today on Augmentin to complete a 14-day course  Essential hypertension - Continue Avapro and hydrochlorothiazide  GERD - Continue lansoprazole  Essential hypertension - Continue olmesartan/HCTZ  BPH - Continue alfuzosin   Discharge Exam: Vitals:   09/05/21 2057  09/06/21 0404  BP: 119/74 117/83  Pulse: 77 69  Resp: 16 12  Temp: 97.6 F (36.4 C) 97.6 F (36.4 C)  SpO2: 97% 91%   Vitals:   09/05/21 1445 09/05/21 2057 09/06/21 0404 09/06/21 0602  BP: 120/81 119/74 117/83   Pulse: 81 77 69   Resp: 16 16 12    Temp: 97.9 F (36.6 C) 97.6 F (36.4 C) 97.6 F (36.4 C)   TempSrc: Oral Oral Oral   SpO2: 97% 97% 91%   Weight:    106.6 kg  Height:        General: Pt is alert, awake, not in acute distress Cardiovascular: RRR, S1/S2 +, no rubs, no gallops Respiratory: CTA bilaterally, no wheezing, no rhonchi Abdominal: Soft, NT, ND, bowel sounds + Extremities: no edema, no cyanosis   Discharge Instructions  Discharge Instructions     Diet - low sodium heart healthy   Complete by: As directed    Low fiber diet   Increase activity slowly   Complete by: As directed       Allergies as of 09/06/2021   No Known Allergies      Medication List     TAKE these medications    acetaminophen 325 MG tablet Commonly known as: TYLENOL Take 2 tablets (650 mg total) by mouth every 6 (six) hours as needed for mild pain (or Fever >/= 101).   alfuzosin 10 MG 24 hr tablet Commonly known as: UROXATRAL Take 10 mg by mouth daily with breakfast.   amoxicillin-clavulanate 875-125 MG tablet Commonly known as: Augmentin Take 1 tablet by mouth every 12 (twelve) hours for 17 doses.   lansoprazole 15 MG capsule Commonly known as: PREVACID Take 15 mg by mouth daily at 12  noon.   levothyroxine 175 MCG tablet Commonly known as: SYNTHROID Take 175 mcg by mouth daily before breakfast.   olmesartan-hydrochlorothiazide 40-25 MG tablet Commonly known as: BENICAR HCT Take 1 tablet by mouth daily.   oxyCODONE 5 MG immediate release tablet Commonly known as: Oxy IR/ROXICODONE Take 1 tablet (5 mg total) by mouth every 4 (four) hours as needed for moderate pain.        Follow-up Information     [facility]. Schedule an appointment as  soon as possible for a visit.   Specialty: Gastroenterology Why: For colonoscopy in 6-8 weeks Contact information: [address] [place] [address] [phone]        Surgery, [facility]. Call.   Specialty: General Surgery Why: After colonoscopy to discuss elective colon resection with colorectal surgery Contact information: [address] [place] [address] [phone]         Jim Like, MD Follow up.   Specialty: Family Medicine Contact information: [address] [place] [address] [phone]                No Known Allergies    CT Abdomen Pelvis W Contrast  Result Date: 09/01/2021 CLINICAL DATA:  Left-sided pain EXAM: CT ABDOMEN AND PELVIS WITH CONTRAST TECHNIQUE: Multidetector CT imaging of the abdomen and pelvis was performed using the standard protocol following bolus administration of intravenous contrast. CONTRAST:  OMNIPAQUE IOHEXOL 300 MG/ML  SOLN COMPARISON:  CT 04/13/2020 FINDINGS: Lower chest: No acute abnormality. Hepatobiliary: No focal liver abnormality is seen. No gallstones, gallbladder wall thickening, or biliary dilatation. Pancreas: Unremarkable. No pancreatic ductal dilatation or surrounding inflammatory changes. Spleen: Normal in size without focal abnormality. Adrenals/Urinary Tract: Adrenal glands are unremarkable. Kidneys are normal, without renal calculi, focal lesion, or hydronephrosis. Bladder is unremarkable. Stomach/Bowel: The stomach is nonenlarged. No dilated small bowel. Negative appendix. Left colon diverticular disease. Focal wall thickening at the sigmoid colon. Moderate inflammatory changes in the left lower quadrant fat. Vascular/Lymphatic: Nonaneurysmal aorta.  No suspicious nodes. Reproductive: Enlarged prostate Other: Multiple small foci of extraluminal gas within the left lower quadrant and central pelvic fat slightly to the left of midline, series 3, image  64, consistent with perforation. Musculoskeletal: No acute osseous abnormality. Small fat containing umbilical hernia with mild soft tissue stranding within and around the hernia. IMPRESSION: 1. Left lower quadrant inflammatory process. Focally thickened sigmoid colon with diverticula present, findings most likely due to acute diverticulitis. Multiple foci of extraluminal gas in the left lower quadrant and central pelvis consistent with perforation. No abscess 2. Small fat containing umbilical hernia with inflammatory changes around the hernia and within the hernia sac, correlate for symptoms of incarcerated fat hernia Critical Value/emergent results were called by telephone at the time of interpretation on 09/01/2021 at 9:43 pm to provider [facility] , who verbally acknowledged these results. Electronically Signed   By: Jasmine Pang M.D.   On: 09/01/2021 21:43     The results of significant diagnostics from this hospitalization (including imaging, microbiology, ancillary and laboratory) are listed below for reference.     Microbiology: Recent Results (from the past 240 hour(s))  Resp Panel by RT-PCR (Flu A&B, Covid) Nasopharyngeal Swab     Status: None   Collection Time: 09/01/21  9:54 PM   Specimen: Nasopharyngeal Swab; Nasopharyngeal(NP) swabs in vial transport medium  Result Value Ref Range Status   SARS Coronavirus 2 by RT PCR NEGATIVE NEGATIVE Final    Comment: (  NOTE) SARS-CoV-2 target nucleic acids are NOT DETECTED.  The SARS-CoV-2 RNA is generally detectable in upper respiratory specimens during the acute phase of infection. The lowest concentration of SARS-CoV-2 viral copies this assay can detect is 138 copies/mL. A negative result does not preclude SARS-Cov-2 infection and should not be used as the sole basis for treatment or other patient management decisions. A negative result may occur with  improper specimen collection/handling, submission of specimen other than  nasopharyngeal swab, presence of viral mutation(s) within the areas targeted by this assay, and inadequate number of viral copies(<138 copies/mL). A negative result must be combined with clinical observations, patient history, and epidemiological information. The expected result is Negative.  Fact Sheet for Patients:  BloggerCourse.com  Fact Sheet for Healthcare Providers:  SeriousBroker.it  This test is no t yet approved or cleared by the [place] FDA and  has been authorized for detection and/or diagnosis of SARS-CoV-2 by FDA under an Emergency Use Authorization (EUA). This EUA will remain  in effect (meaning this test can be used) for the duration of the COVID-19 declaration under Section 564(b)(1) of the Act, 21 U.S.C.section 360bbb-3(b)(1), unless the authorization is terminated  or revoked sooner.       Influenza A by PCR NEGATIVE NEGATIVE Final   Influenza B by PCR NEGATIVE NEGATIVE Final    Comment: (NOTE) The Xpert Xpress SARS-CoV-2/FLU/RSV plus assay is intended as an aid in the diagnosis of influenza from Nasopharyngeal swab specimens and should not be used as a sole basis for treatment. Nasal washings and aspirates are unacceptable for Xpert Xpress SARS-CoV-2/FLU/RSV testing.  Fact Sheet for Patients: BloggerCourse.com  Fact Sheet for Healthcare Providers: SeriousBroker.it  This test is not yet approved or cleared by the [place] FDA and has been authorized for detection and/or diagnosis of SARS-CoV-2 by FDA under an Emergency Use Authorization (EUA). This EUA will remain in effect (meaning this test can be used) for the duration of the COVID-19 declaration under Section 564(b)(1) of the Act, 21 U.S.C. section 360bbb-3(b)(1), unless the authorization is terminated or revoked.  Performed at [facility], [address] [place], [place]  [address]   Culture, blood (Routine X 2) w Reflex to ID Panel     Status: Abnormal   Collection Time: 09/02/21  6:44 AM   Specimen: BLOOD  Result Value Ref Range Status   Specimen Description   Final    BLOOD RIGHT ANTECUBITAL Performed at [facility], [address], [place], [place] [address]    Special Requests   Final    BOTTLES DRAWN AEROBIC AND ANAEROBIC Blood Culture adequate volume Performed at [facility], [address], [place], [place] [address]    Culture  Setup Time   Final    GRAM POSITIVE COCCI AEROBIC BOTTLE ONLY CRITICAL RESULT CALLED TO, READ BACK BY AND VERIFIED WITH: M,JAMES PHARMD @0732  09/03/21 EB    Culture (A)  Final    STAPHYLOCOCCUS HOMINIS THE SIGNIFICANCE OF ISOLATING THIS ORGANISM FROM A SINGLE SET OF BLOOD CULTURES WHEN MULTIPLE SETS ARE DRAWN IS UNCERTAIN. PLEASE NOTIFY THE MICROBIOLOGY DEPARTMENT WITHIN ONE WEEK IF SPECIATION AND SENSITIVITIES ARE REQUIRED. Performed at [facility], [address] [place], [place]    Report Status 09/04/2021 FINAL  Final  Culture, blood (Routine X 2) w Reflex to ID Panel     Status: None (Preliminary result)   Collection Time: 09/02/21  6:44 AM   Specimen: BLOOD  Result Value Ref Range Status   Specimen Description   Final    BLOOD BLOOD RIGHT FOREARM Performed at [facility], [address], [place], [place] [address]    Special Requests   Final    BOTTLES DRAWN AEROBIC ONLY Blood Culture results may not be optimal due to an inadequate volume of blood received in culture bottles Performed at [facility], [address] [place], [place] [address]    Culture   Final    NO GROWTH 4 DAYS Performed at [facility], [address], [place], [place] [address]    Report Status PENDING  Incomplete  Blood Culture ID Panel (Reflexed)     Status: Abnormal   Collection Time: 09/02/21  6:44 AM  Result Value Ref Range  Status   Enterococcus faecalis NOT DETECTED NOT DETECTED Final   Enterococcus Faecium NOT DETECTED NOT DETECTED Final   Listeria monocytogenes NOT DETECTED NOT DETECTED Final   Staphylococcus species DETECTED (A) NOT DETECTED Final    Comment: CRITICAL RESULT CALLED TO, READ BACK BY AND VERIFIED WITH: M,JAMES PHARMD @0732  09/03/21 EB    Staphylococcus aureus (BCID) NOT DETECTED NOT DETECTED Final   Staphylococcus epidermidis NOT DETECTED NOT DETECTED Final   Staphylococcus lugdunensis NOT DETECTED NOT DETECTED Final   Streptococcus species NOT DETECTED NOT DETECTED Final   Streptococcus agalactiae NOT DETECTED NOT DETECTED Final   Streptococcus pneumoniae NOT DETECTED NOT DETECTED Final   Streptococcus pyogenes NOT DETECTED NOT DETECTED Final   A.calcoaceticus-baumannii NOT DETECTED NOT DETECTED Final   Bacteroides fragilis NOT DETECTED NOT DETECTED Final   Enterobacterales NOT DETECTED NOT DETECTED Final   Enterobacter cloacae complex NOT DETECTED NOT DETECTED Final   Escherichia coli NOT DETECTED NOT DETECTED Final   Klebsiella aerogenes NOT DETECTED NOT DETECTED Final   Klebsiella oxytoca NOT DETECTED NOT DETECTED Final   Klebsiella pneumoniae NOT DETECTED NOT DETECTED Final   Proteus species NOT DETECTED NOT DETECTED Final   Salmonella species NOT DETECTED NOT DETECTED Final   Serratia marcescens NOT DETECTED NOT DETECTED Final   Haemophilus influenzae NOT DETECTED NOT DETECTED Final   Neisseria meningitidis NOT DETECTED NOT DETECTED Final   Pseudomonas aeruginosa NOT DETECTED NOT DETECTED Final   Stenotrophomonas maltophilia NOT DETECTED NOT DETECTED Final   Candida albicans NOT DETECTED NOT DETECTED Final   Candida auris NOT DETECTED NOT DETECTED Final   Candida glabrata NOT DETECTED NOT DETECTED Final   Candida krusei NOT DETECTED NOT DETECTED Final   Candida parapsilosis NOT DETECTED NOT DETECTED Final   Candida tropicalis NOT DETECTED NOT DETECTED Final   Cryptococcus  neoformans/gattii NOT DETECTED NOT DETECTED Final    Comment: Performed at [facility], [address] [place], [place]     Labs: BNP (last 3 results) No results for input(s): BNP in the last 8760 hours. Basic Metabolic Panel: Recent Labs  Lab 09/02/21 0644 09/03/21 1331 09/04/21 0517 09/05/21 0634 09/06/21 0528  NA 137 136 137 139 136  K 3.5 3.5 3.3* 3.5 3.4*  CL 105 100 100 102 99  CO2 28 30 34* 30 30  GLUCOSE 117* 156* 111* 108* 103*  BUN 12 13 9 8 8   CREATININE 1.24 1.44* 1.31* 1.02 1.20  CALCIUM 8.5* 8.7* 8.4* 8.7* 8.9  MG 1.8  1.8  --   --   --   --    Liver Function Tests: Recent Labs  Lab 09/02/21 0644 09/03/21 1331 09/04/21 0517 09/05/21  0454 09/06/21 0528  AST 18 17 13* 14* 16  ALT ALKPHOS 39 36* 34* 34* 34*  BILITOT 1.2 2.0* 1.1 0.5 0.4  PROT 6.0* 6.6 6.0* 6.6 6.5  ALBUMIN 3.4* 3.5 3.0* 3.2* 3.2*   Recent Labs  Lab 09/01/21 1956  LIPASE 26   No results for input(s): AMMONIA in the last 168 hours. CBC: Recent Labs  Lab 09/01/21 1956 09/02/21 0644 09/03/21 1331 09/04/21 0517 09/05/21 0634 09/06/21 0528  WBC 13.7* 11.1* 10.1 8.2 6.8 5.9  NEUTROABS 11.1* 8.6*  --   --   --   --   HGB 14.4 12.8* 13.2 12.0* 12.2* 12.3*  HCT 43.1 39.7 41.3 37.9* 37.1* 38.1*  MCV 83.4 86.3 86.8 87.9 86.5 87.0  PLT 203 173 185 179 238 250   Cardiac Enzymes: No results for input(s): CKTOTAL, CKMB, CKMBINDEX, TROPONINI in the last 168 hours. BNP: Invalid input(s): POCBNP CBG: No results for input(s): GLUCAP in the last 168 hours. D-Dimer No results for input(s): DDIMER in the last 72 hours. Hgb A1c No results for input(s): HGBA1C in the last 72 hours. Lipid Profile No results for input(s): CHOL, HDL, LDLCALC, TRIG, CHOLHDL, LDLDIRECT in the last 72 hours. Thyroid function studies No results for input(s): TSH, T4TOTAL, T3FREE, THYROIDAB in the last 72 hours.  Invalid input(s): FREET3 Anemia work up No results for  input(s): VITAMINB12, FOLATE, FERRITIN, TIBC, IRON, RETICCTPCT in the last 72 hours. Urinalysis    Component Value Date/Time   COLORURINE YELLOW 09/01/2021 1935   APPEARANCEUR CLEAR 09/01/2021 1935   LABSPEC 1.020 09/01/2021 1935   PHURINE 7.0 09/01/2021 1935   GLUCOSEU NEGATIVE 09/01/2021 1935   HGBUR NEGATIVE 09/01/2021 1935   BILIRUBINUR NEGATIVE 09/01/2021 1935   BILIRUBINUR negative 09/01/2021 1842   KETONESUR NEGATIVE 09/01/2021 1935   KETONESUR negative 09/01/2021 1842   PROTEINUR NEGATIVE 09/01/2021 1935   PROTEINUR negative 09/01/2021 1842   UROBILINOGEN 0.2 09/01/2021 1842   NITRITE NEGATIVE 09/01/2021 1935   NITRITE Negative 09/01/2021 1842   LEUKOCYTESUR NEGATIVE 09/01/2021 1935   LEUKOCYTESUR Negative 09/01/2021 1842   Sepsis Labs Invalid input(s): PROCALCITONIN,  WBC,  LACTICIDVEN Microbiology Recent Results (from the past 240 hour(s))  Resp Panel by RT-PCR (Flu A&B, Covid) Nasopharyngeal Swab     Status: None   Collection Time: 09/01/21  9:54 PM   Specimen: Nasopharyngeal Swab; Nasopharyngeal(NP) swabs in vial transport medium  Result Value Ref Range Status   SARS Coronavirus 2 by RT PCR NEGATIVE NEGATIVE Final    Comment: (NOTE) SARS-CoV-2 target nucleic acids are NOT DETECTED.  The SARS-CoV-2 RNA is generally detectable in upper respiratory specimens during the acute phase of infection. The lowest concentration of SARS-CoV-2 viral copies this assay can detect is 138 copies/mL. A negative result does not preclude SARS-Cov-2 infection and should not be used as the sole basis for treatment or other patient management decisions. A negative result may occur with  improper specimen collection/handling, submission of specimen other than nasopharyngeal swab, presence of viral mutation(s) within the areas targeted by this assay, and inadequate number of viral copies(<138 copies/mL). A negative result must be combined with clinical observations, patient history, and  epidemiological information. The expected result is Negative.  Fact Sheet for Patients:  BloggerCourse.com  Fact Sheet for Healthcare Providers:  SeriousBroker.it  This test is no t yet approved or cleared by the [place] FDA and  has been authorized for detection and/or diagnosis of SARS-CoV-2 by FDA under an Emergency  Use Authorization (EUA). This EUA will remain  in effect (meaning this test can be used) for the duration of the COVID-19 declaration under Section 564(b)(1) of the Act, 21 U.S.C.section 360bbb-3(b)(1), unless the authorization is terminated  or revoked sooner.       Influenza A by PCR NEGATIVE NEGATIVE Final   Influenza B by PCR NEGATIVE NEGATIVE Final    Comment: (NOTE) The Xpert Xpress SARS-CoV-2/FLU/RSV plus assay is intended as an aid in the diagnosis of influenza from Nasopharyngeal swab specimens and should not be used as a sole basis for treatment. Nasal washings and aspirates are unacceptable for Xpert Xpress SARS-CoV-2/FLU/RSV testing.  Fact Sheet for Patients: BloggerCourse.com  Fact Sheet for Healthcare Providers: SeriousBroker.it  This test is not yet approved or cleared by the [place] FDA and has been authorized for detection and/or diagnosis of SARS-CoV-2 by FDA under an Emergency Use Authorization (EUA). This EUA will remain in effect (meaning this test can be used) for the duration of the COVID-19 declaration under Section 564(b)(1) of the Act, 21 U.S.C. section 360bbb-3(b)(1), unless the authorization is terminated or revoked.  Performed at [facility], [address] [place], [place] [address]   Culture, blood (Routine X 2) w Reflex to ID Panel     Status: Abnormal   Collection Time: 09/02/21  6:44 AM   Specimen: BLOOD  Result Value Ref Range Status   Specimen Description   Final    BLOOD RIGHT  ANTECUBITAL Performed at [facility], [address] [place], [place] [address]    Special Requests   Final    BOTTLES DRAWN AEROBIC AND ANAEROBIC Blood Culture adequate volume Performed at [facility], [address] [place], [place] [address]    Culture  Setup Time   Final    GRAM POSITIVE COCCI AEROBIC BOTTLE ONLY CRITICAL RESULT CALLED TO, READ BACK BY AND VERIFIED WITH: M,JAMES PHARMD  09/03/21 EB    Culture (A)  Final    STAPHYLOCOCCUS HOMINIS THE SIGNIFICANCE OF ISOLATING THIS ORGANISM FROM A SINGLE SET OF BLOOD CULTURES WHEN MULTIPLE SETS ARE DRAWN IS UNCERTAIN. PLEASE NOTIFY THE MICROBIOLOGY DEPARTMENT WITHIN ONE WEEK IF SPECIATION AND SENSITIVITIES ARE REQUIRED. Performed at [facility], [address], [place], [place] [address]    Report Status 09/04/2021 FINAL  Final  Culture, blood (Routine X 2) w Reflex to ID Panel     Status: None (Preliminary result)   Collection Time: 09/02/21  6:44 AM   Specimen: BLOOD  Result Value Ref Range Status   Specimen Description   Final    BLOOD BLOOD RIGHT FOREARM Performed at [facility], [address], [place], [place] [address]    Special Requests   Final    BOTTLES DRAWN AEROBIC ONLY Blood Culture results may not be optimal due to an inadequate volume of blood received in culture bottles Performed at [facility], [address], [place], [place] [address]    Culture   Final    NO GROWTH 4 DAYS Performed at [facility], [address], [place], [place] [address]    Report Status PENDING  Incomplete  Blood Culture ID Panel (Reflexed)     Status: Abnormal   Collection Time: 09/02/21  6:44 AM  Result Value Ref Range Status   Enterococcus faecalis NOT DETECTED NOT DETECTED Final   Enterococcus Faecium NOT DETECTED NOT DETECTED Final   Listeria monocytogenes NOT DETECTED NOT  DETECTED Final   Staphylococcus species DETECTED (A) NOT  DETECTED Final    Comment: CRITICAL RESULT CALLED TO, READ BACK BY AND VERIFIED WITH: M,JAMES PHARMD  09/03/21 EB    Staphylococcus aureus (BCID) NOT DETECTED NOT DETECTED Final   Staphylococcus epidermidis NOT DETECTED NOT DETECTED Final   Staphylococcus lugdunensis NOT DETECTED NOT DETECTED Final   Streptococcus species NOT DETECTED NOT DETECTED Final   Streptococcus agalactiae NOT DETECTED NOT DETECTED Final   Streptococcus pneumoniae NOT DETECTED NOT DETECTED Final   Streptococcus pyogenes NOT DETECTED NOT DETECTED Final   A.calcoaceticus-baumannii NOT DETECTED NOT DETECTED Final   Bacteroides fragilis NOT DETECTED NOT DETECTED Final   Enterobacterales NOT DETECTED NOT DETECTED Final   Enterobacter cloacae complex NOT DETECTED NOT DETECTED Final   Escherichia coli NOT DETECTED NOT DETECTED Final   Klebsiella aerogenes NOT DETECTED NOT DETECTED Final   Klebsiella oxytoca NOT DETECTED NOT DETECTED Final   Klebsiella pneumoniae NOT DETECTED NOT DETECTED Final   Proteus species NOT DETECTED NOT DETECTED Final   Salmonella species NOT DETECTED NOT DETECTED Final   Serratia marcescens NOT DETECTED NOT DETECTED Final   Haemophilus influenzae NOT DETECTED NOT DETECTED Final   Neisseria meningitidis NOT DETECTED NOT DETECTED Final   Pseudomonas aeruginosa NOT DETECTED NOT DETECTED Final   Stenotrophomonas maltophilia NOT DETECTED NOT DETECTED Final   Candida albicans NOT DETECTED NOT DETECTED Final   Candida auris NOT DETECTED NOT DETECTED Final   Candida glabrata NOT DETECTED NOT DETECTED Final   Candida krusei NOT DETECTED NOT DETECTED Final   Candida parapsilosis NOT DETECTED NOT DETECTED Final   Candida tropicalis NOT DETECTED NOT DETECTED Final   Cryptococcus neoformans/gattii NOT DETECTED NOT DETECTED Final    Comment: Performed at [facility], [address], [place], [place] [address]     Time coordinating discharge in minutes: 65  SIGNED:   Calvert Cantor,  MD  [facility] 09/06/2021, 3:00 PM

## 2021-09-06 NOTE — Progress Notes (Signed)
Pt discharge education completed with pt and with wife at the bedside. All questions answered, additional handouts also provided per pt request regarding diet restrictions and recommendations. All belongings sent with pt at time of discharge. Encouraged pt to f/u with PCP following discharge with any other concerns that may arise.

## 2021-09-07 LAB — CULTURE, BLOOD (ROUTINE X 2): Culture: NO GROWTH

## 2021-09-12 DIAGNOSIS — K5720 Diverticulitis of large intestine with perforation and abscess without bleeding: Secondary | ICD-10-CM | POA: Diagnosis not present

## 2021-10-04 DIAGNOSIS — J4530 Mild persistent asthma, uncomplicated: Secondary | ICD-10-CM | POA: Diagnosis not present

## 2021-10-10 DIAGNOSIS — K5720 Diverticulitis of large intestine with perforation and abscess without bleeding: Secondary | ICD-10-CM | POA: Diagnosis not present

## 2021-10-10 DIAGNOSIS — J45901 Unspecified asthma with (acute) exacerbation: Secondary | ICD-10-CM | POA: Diagnosis not present

## 2021-12-28 DIAGNOSIS — M5116 Intervertebral disc disorders with radiculopathy, lumbar region: Secondary | ICD-10-CM | POA: Diagnosis not present

## 2021-12-28 DIAGNOSIS — M519 Unspecified thoracic, thoracolumbar and lumbosacral intervertebral disc disorder: Secondary | ICD-10-CM | POA: Diagnosis not present

## 2021-12-28 DIAGNOSIS — Z6830 Body mass index (BMI) 30.0-30.9, adult: Secondary | ICD-10-CM | POA: Diagnosis not present

## 2021-12-28 DIAGNOSIS — E6609 Other obesity due to excess calories: Secondary | ICD-10-CM | POA: Diagnosis not present

## 2021-12-28 DIAGNOSIS — M545 Low back pain, unspecified: Secondary | ICD-10-CM | POA: Diagnosis not present

## 2022-01-19 DIAGNOSIS — K5792 Diverticulitis of intestine, part unspecified, without perforation or abscess without bleeding: Secondary | ICD-10-CM | POA: Diagnosis not present

## 2022-01-19 DIAGNOSIS — E039 Hypothyroidism, unspecified: Secondary | ICD-10-CM | POA: Diagnosis not present

## 2022-02-28 DIAGNOSIS — L814 Other melanin hyperpigmentation: Secondary | ICD-10-CM | POA: Diagnosis not present

## 2022-02-28 DIAGNOSIS — D235 Other benign neoplasm of skin of trunk: Secondary | ICD-10-CM | POA: Diagnosis not present

## 2022-02-28 DIAGNOSIS — L821 Other seborrheic keratosis: Secondary | ICD-10-CM | POA: Diagnosis not present

## 2022-02-28 DIAGNOSIS — D485 Neoplasm of uncertain behavior of skin: Secondary | ICD-10-CM | POA: Diagnosis not present

## 2022-02-28 DIAGNOSIS — C44622 Squamous cell carcinoma of skin of right upper limb, including shoulder: Secondary | ICD-10-CM | POA: Diagnosis not present

## 2022-03-21 DIAGNOSIS — C44622 Squamous cell carcinoma of skin of right upper limb, including shoulder: Secondary | ICD-10-CM | POA: Diagnosis not present

## 2022-03-21 DIAGNOSIS — L905 Scar conditions and fibrosis of skin: Secondary | ICD-10-CM | POA: Diagnosis not present

## 2022-06-18 DIAGNOSIS — Z6832 Body mass index (BMI) 32.0-32.9, adult: Secondary | ICD-10-CM | POA: Diagnosis not present

## 2022-06-18 DIAGNOSIS — E6609 Other obesity due to excess calories: Secondary | ICD-10-CM | POA: Diagnosis not present

## 2022-06-18 DIAGNOSIS — M5116 Intervertebral disc disorders with radiculopathy, lumbar region: Secondary | ICD-10-CM | POA: Diagnosis not present

## 2022-06-18 DIAGNOSIS — M519 Unspecified thoracic, thoracolumbar and lumbosacral intervertebral disc disorder: Secondary | ICD-10-CM | POA: Diagnosis not present

## 2022-06-18 DIAGNOSIS — M545 Low back pain, unspecified: Secondary | ICD-10-CM | POA: Diagnosis not present

## 2022-08-16 DIAGNOSIS — R0609 Other forms of dyspnea: Secondary | ICD-10-CM | POA: Diagnosis not present

## 2022-08-16 DIAGNOSIS — R209 Unspecified disturbances of skin sensation: Secondary | ICD-10-CM | POA: Diagnosis not present

## 2022-08-16 DIAGNOSIS — E039 Hypothyroidism, unspecified: Secondary | ICD-10-CM | POA: Diagnosis not present

## 2022-08-16 DIAGNOSIS — R5381 Other malaise: Secondary | ICD-10-CM | POA: Diagnosis not present

## 2022-08-16 DIAGNOSIS — J4530 Mild persistent asthma, uncomplicated: Secondary | ICD-10-CM | POA: Diagnosis not present

## 2022-09-07 DIAGNOSIS — I10 Essential (primary) hypertension: Secondary | ICD-10-CM | POA: Diagnosis not present

## 2022-09-07 DIAGNOSIS — R600 Localized edema: Secondary | ICD-10-CM | POA: Diagnosis not present

## 2022-09-07 DIAGNOSIS — G4733 Obstructive sleep apnea (adult) (pediatric): Secondary | ICD-10-CM | POA: Diagnosis not present

## 2022-10-04 IMAGING — CT CT ABD-PELV W/ CM
2 of 5 series · 16 of 46 positions shown, 18 images · IV contrast (Omnipaque)
Comparison: CT 04/13/2020

CLINICAL DATA: Left-sided pain

EXAM:
CT ABDOMEN AND PELVIS WITH CONTRAST
TECHNIQUE: Multidetector CT imaging of the abdomen and pelvis was performed
using the standard protocol following bolus administration of
intravenous contrast.
CONTRAST:  100mL OMNIPAQUE IOHEXOL 300 MG/ML  SOLN

[Series 3: axial st · axial · 0.98mm/px · z∈[+480,+944]mm · 13 of 105 slices shown, 15 images]
[im 6/105  soft-tissue]
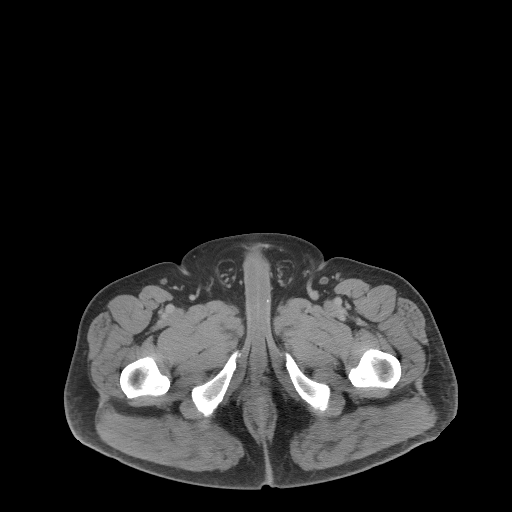
[im 6/105  bone]
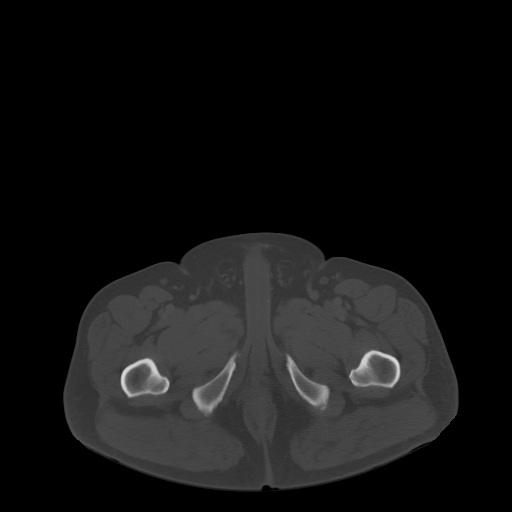
[im 12/105  soft-tissue]
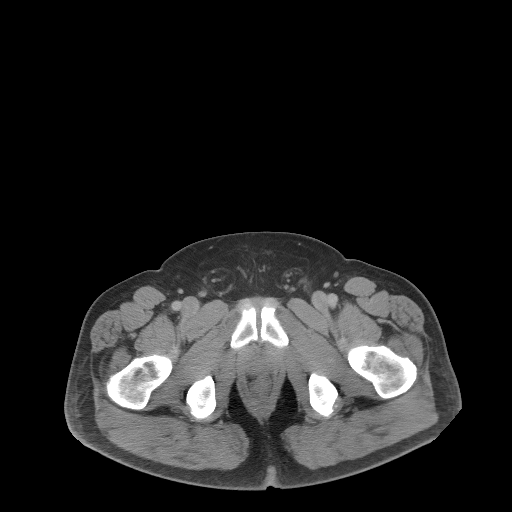
[im 24/105  soft-tissue]
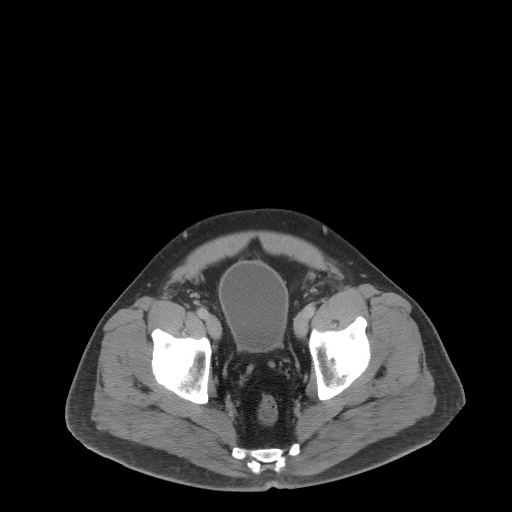
[im 29/105  soft-tissue]
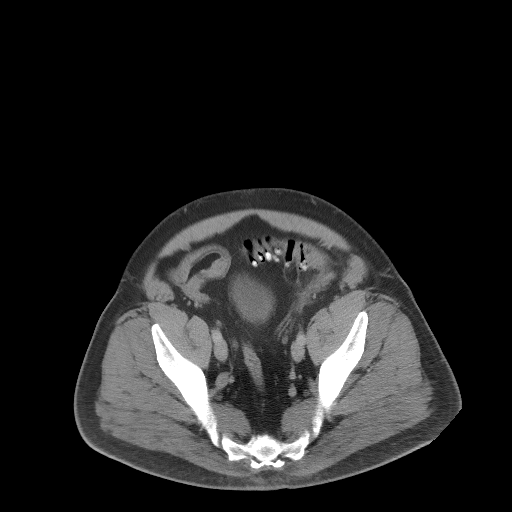
[im 35/105  soft-tissue]
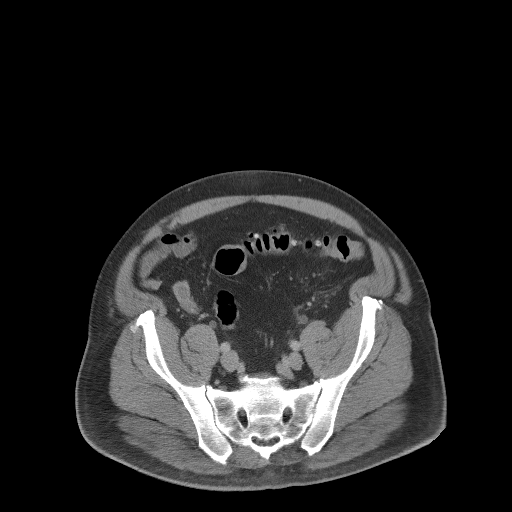
[im 47/105  soft-tissue]
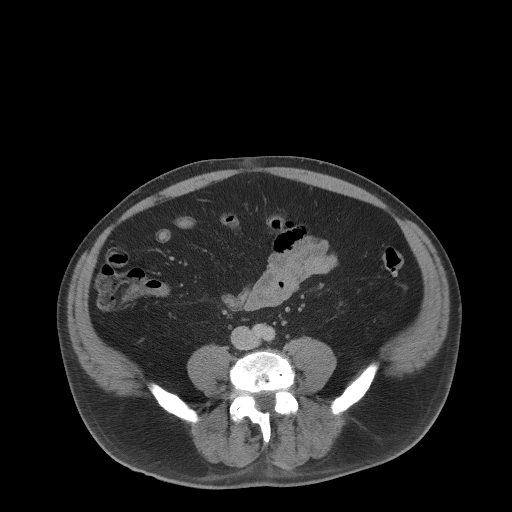
[im 53/105  soft-tissue]
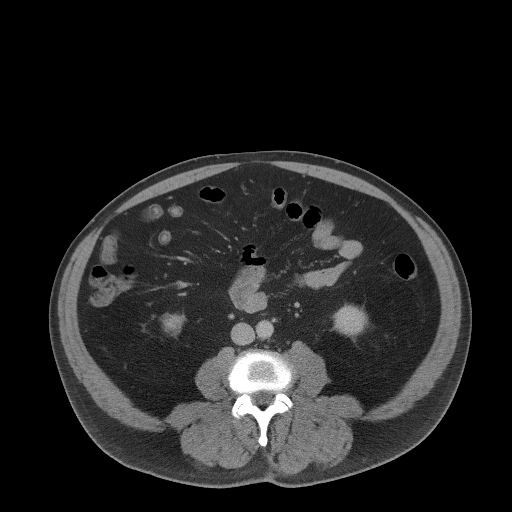
[im 58/105  soft-tissue]
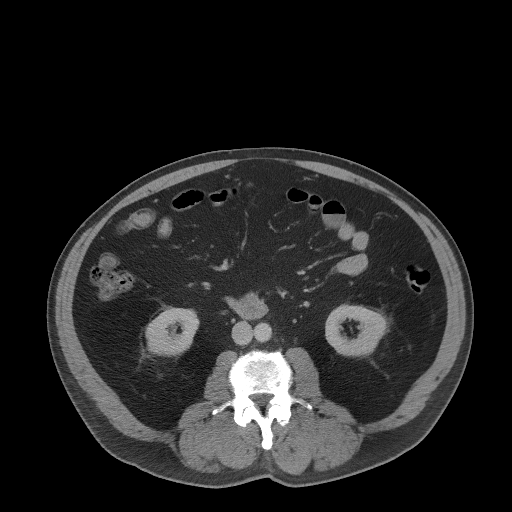
[im 70/105  soft-tissue]
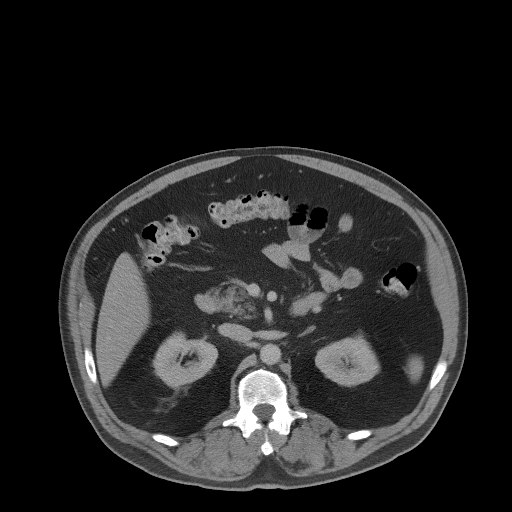
[im 70/105  bone]
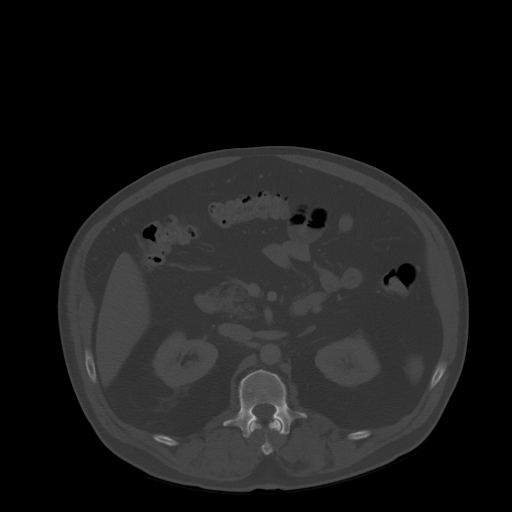
[im 76/105  soft-tissue]
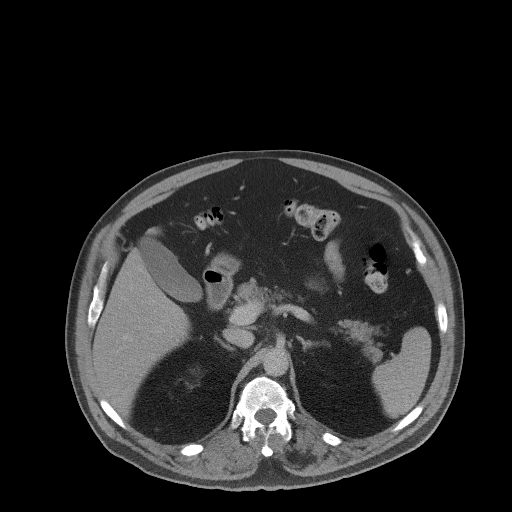
[im 81/105  soft-tissue]
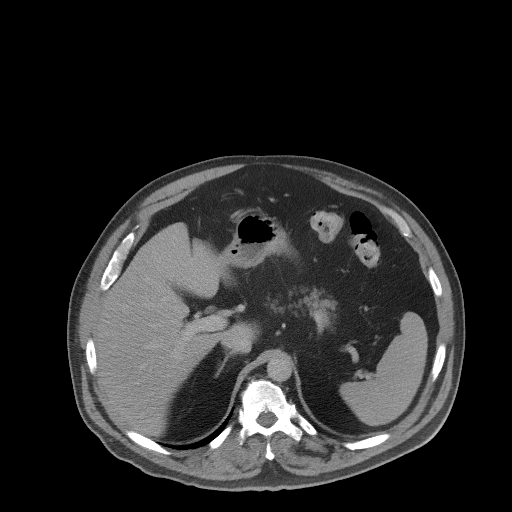
[im 93/105  soft-tissue]
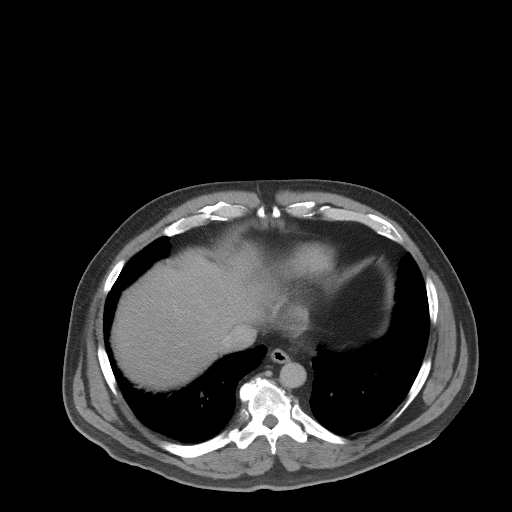
[im 99/105  soft-tissue]
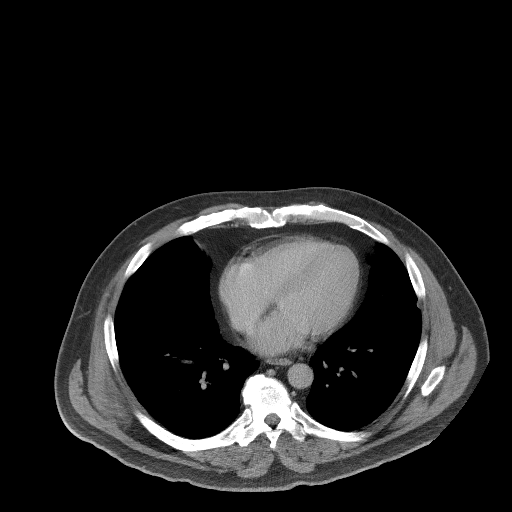

[Series 6: coronal st · coronal · 0.87mm/px · 3 of 120 slices shown]
[im 40/120  soft-tissue]
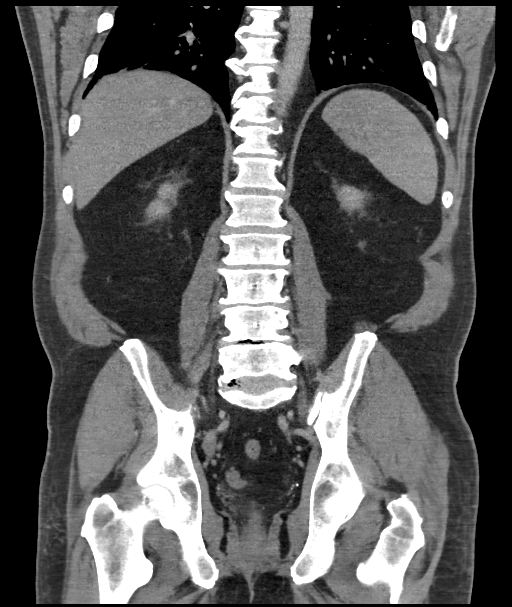
[im 53/120  soft-tissue]
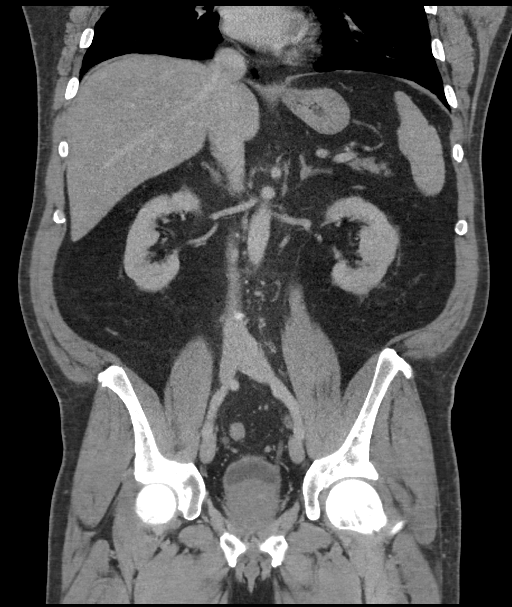
[im 67/120  soft-tissue]
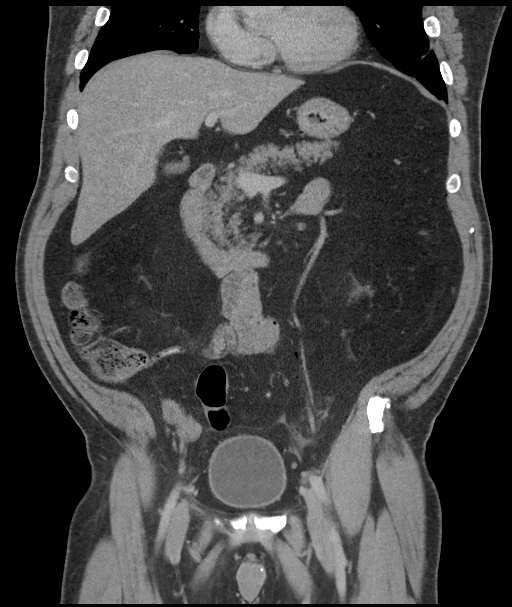

[16 of 46 positions shown; findings below may reference images not displayed]

FINDINGS: Lower chest: No acute abnormality.

Hepatobiliary: No focal liver abnormality is seen. No gallstones,
gallbladder wall thickening, or biliary dilatation.

Pancreas: Unremarkable. No pancreatic ductal dilatation or
surrounding inflammatory changes.

Spleen: Normal in size without focal abnormality.

Adrenals/Urinary Tract: Adrenal glands are unremarkable. Kidneys are
normal, without renal calculi, focal lesion, or hydronephrosis.
Bladder is unremarkable.

Stomach/Bowel: The stomach is nonenlarged. No dilated small bowel.
Negative appendix. Left colon diverticular disease. Focal wall
thickening at the sigmoid colon. Moderate inflammatory changes in
the left lower quadrant fat.

Vascular/Lymphatic: Nonaneurysmal aorta.  No suspicious nodes.

Reproductive: Enlarged prostate

Other: Multiple small foci of extraluminal gas within the left lower
quadrant and central pelvic fat slightly to the left of midline,
series 3, image 64, consistent with perforation.

Musculoskeletal: No acute osseous abnormality. Small fat containing
umbilical hernia with mild soft tissue stranding within and around
the hernia.
IMPRESSION: 1. Left lower quadrant inflammatory process. Focally thickened
sigmoid colon with diverticula present, findings most likely due to
acute diverticulitis. Multiple foci of extraluminal gas in the left
lower quadrant and central pelvis consistent with perforation. No
abscess
2. Small fat containing umbilical hernia with inflammatory changes
around the hernia and within the hernia sac, correlate for symptoms
of incarcerated fat hernia

Critical Value/emergent results were called by telephone at the time
of interpretation on 09/01/2021 at [DATE] to provider CLINTON SADID
TIU , who verbally acknowledged these results.

## 2022-11-16 DIAGNOSIS — K5792 Diverticulitis of intestine, part unspecified, without perforation or abscess without bleeding: Secondary | ICD-10-CM | POA: Diagnosis not present

## 2022-11-23 DIAGNOSIS — M519 Unspecified thoracic, thoracolumbar and lumbosacral intervertebral disc disorder: Secondary | ICD-10-CM | POA: Diagnosis not present

## 2022-11-26 DIAGNOSIS — M549 Dorsalgia, unspecified: Secondary | ICD-10-CM | POA: Diagnosis not present

## 2022-11-26 DIAGNOSIS — R209 Unspecified disturbances of skin sensation: Secondary | ICD-10-CM | POA: Diagnosis not present

## 2022-12-13 DIAGNOSIS — L570 Actinic keratosis: Secondary | ICD-10-CM | POA: Diagnosis not present

## 2022-12-13 DIAGNOSIS — L820 Inflamed seborrheic keratosis: Secondary | ICD-10-CM | POA: Diagnosis not present

## 2022-12-13 DIAGNOSIS — Z85828 Personal history of other malignant neoplasm of skin: Secondary | ICD-10-CM | POA: Diagnosis not present

## 2022-12-13 DIAGNOSIS — L814 Other melanin hyperpigmentation: Secondary | ICD-10-CM | POA: Diagnosis not present

## 2023-01-01 DIAGNOSIS — Z6832 Body mass index (BMI) 32.0-32.9, adult: Secondary | ICD-10-CM | POA: Diagnosis not present

## 2023-01-01 DIAGNOSIS — E6609 Other obesity due to excess calories: Secondary | ICD-10-CM | POA: Diagnosis not present

## 2023-01-01 DIAGNOSIS — R1012 Left upper quadrant pain: Secondary | ICD-10-CM | POA: Diagnosis not present

## 2023-01-01 DIAGNOSIS — M519 Unspecified thoracic, thoracolumbar and lumbosacral intervertebral disc disorder: Secondary | ICD-10-CM | POA: Diagnosis not present

## 2023-01-04 DIAGNOSIS — D120 Benign neoplasm of cecum: Secondary | ICD-10-CM | POA: Diagnosis not present

## 2023-01-04 DIAGNOSIS — D124 Benign neoplasm of descending colon: Secondary | ICD-10-CM | POA: Diagnosis not present

## 2023-01-04 DIAGNOSIS — Z86010 Personal history of colonic polyps: Secondary | ICD-10-CM | POA: Diagnosis not present

## 2023-01-04 DIAGNOSIS — D122 Benign neoplasm of ascending colon: Secondary | ICD-10-CM | POA: Diagnosis not present

## 2023-01-04 DIAGNOSIS — Z09 Encounter for follow-up examination after completed treatment for conditions other than malignant neoplasm: Secondary | ICD-10-CM | POA: Diagnosis not present

## 2023-01-04 DIAGNOSIS — K621 Rectal polyp: Secondary | ICD-10-CM | POA: Diagnosis not present

## 2023-01-25 DIAGNOSIS — G4730 Sleep apnea, unspecified: Secondary | ICD-10-CM | POA: Diagnosis not present

## 2023-01-27 ENCOUNTER — Encounter: Payer: Self-pay | Admitting: Emergency Medicine

## 2023-01-27 ENCOUNTER — Ambulatory Visit (INDEPENDENT_AMBULATORY_CARE_PROVIDER_SITE_OTHER): Payer: BC Managed Care – PPO

## 2023-01-27 ENCOUNTER — Ambulatory Visit
Admission: EM | Admit: 2023-01-27 | Discharge: 2023-01-27 | Disposition: A | Discharge status: Home / Self Care | Payer: BC Managed Care – PPO | Attending: Family Medicine | Admitting: Family Medicine

## 2023-01-27 DIAGNOSIS — R509 Fever, unspecified: Secondary | ICD-10-CM | POA: Diagnosis not present

## 2023-01-27 DIAGNOSIS — J101 Influenza due to other identified influenza virus with other respiratory manifestations: Secondary | ICD-10-CM | POA: Diagnosis not present

## 2023-01-27 DIAGNOSIS — R062 Wheezing: Secondary | ICD-10-CM | POA: Diagnosis not present

## 2023-01-27 DIAGNOSIS — J111 Influenza due to unidentified influenza virus with other respiratory manifestations: Secondary | ICD-10-CM

## 2023-01-27 DIAGNOSIS — R059 Cough, unspecified: Secondary | ICD-10-CM | POA: Diagnosis not present

## 2023-01-27 DIAGNOSIS — J4521 Mild intermittent asthma with (acute) exacerbation: Secondary | ICD-10-CM

## 2023-01-27 HISTORY — DX: Unspecified asthma, uncomplicated: J45.909

## 2023-01-27 LAB — POCT INFLUENZA A/B
Influenza A, POC: POSITIVE — AB
Influenza B, POC: NEGATIVE

## 2023-01-27 MED ORDER — KETOROLAC TROMETHAMINE 30 MG/ML IJ SOLN
30.0000 mg | Freq: Once | INTRAMUSCULAR | Status: AC
  Administered 2023-01-27: 30 mg via INTRAMUSCULAR

## 2023-01-27 MED ORDER — ACETAMINOPHEN 325 MG PO TABS
650.0000 mg | ORAL_TABLET | Freq: Once | ORAL | Status: AC
  Administered 2023-01-27: 650 mg via ORAL

## 2023-01-27 MED ORDER — HYDROCOD POLI-CHLORPHE POLI ER 10-8 MG/5ML PO SUER: 5.0000 mL | Freq: Two times a day (BID) | ORAL | 0 refills | Status: DC | PRN

## 2023-01-27 MED ORDER — OSELTAMIVIR PHOSPHATE 75 MG PO CAPS: 75.0000 mg | ORAL_CAPSULE | Freq: Two times a day (BID) | ORAL | 0 refills | Status: DC

## 2023-01-27 MED ORDER — ALBUTEROL SULFATE (2.5 MG/3ML) 0.083% IN NEBU
2.5000 mg | INHALATION_SOLUTION | Freq: Once | RESPIRATORY_TRACT | Status: AC
  Administered 2023-01-27: 2.5 mg via RESPIRATORY_TRACT

## 2023-01-27 MED ORDER — ALBUTEROL SULFATE HFA 108 (90 BASE) MCG/ACT IN AERS: 1.0000 | INHALATION_SPRAY | Freq: Four times a day (QID) | RESPIRATORY_TRACT | 0 refills | Status: AC | PRN

## 2023-01-27 MED ORDER — AZITHROMYCIN 250 MG PO TABS: ORAL_TABLET | ORAL | 0 refills | Status: DC

## 2023-01-27 NOTE — Discharge Instructions (Signed)
The shot of Toradol will kick in in 30 to 40 minutes and help you sleep.  It is a pain medicine. I have prescribed Tamiflu to take 2 times a day for 5 days.  This is an antiviral medicine to reduce your flu symptoms I have prescribed a Z-Pak to take as an antibiotic.  This is in addition to prevent bacterial infection I have prescribed Tussionex which is a stronger cough medicine with hydrocodone.  You can use this every 12 hours I also refilled your inhaler in case you have wheezing Home to rest.  Drink lots of fluids Call for problems

## 2023-01-27 NOTE — ED Triage Notes (Signed)
Cough & congestion since Friday  Temp 101 at 1030- 600mg  Ibuprofen  Sinus  drainage  Sore throat  Body Aches  Here w/ wife  COVID test negative  on Friday & Saturday

## 2023-01-27 NOTE — ED Provider Notes (Signed)
[name]    CSN: [id number] Arrival date & time: 01/27/23  1224      History   Chief Complaint Chief Complaint  Patient presents with   Cough    HPI Wayne Gross is a 62 y.o. male.   HPI  Patient states that he feels awful.  This is the worst she is felt in years.  He is has cough and congestion for 2 days.  Temperature to 101.  He has got sinus drainage, runny nose, sore throat, body aches, chest pain with coughing.  Coughing up yellow sputum.  COVID testing at home was negative.  He does have have underlying asthma .  He does feel short of breath.  Wife has noted wheezing.  Past Medical History:  Diagnosis Date   Asthma    Diverticulitis    GERD (gastroesophageal reflux disease)    Hypertension    Thyroid disease     Patient Active Problem List   Diagnosis Date Noted   Perforated diverticulum 09/06/2021   Sepsis ([place]) 09/02/2021   Thyroid disease    GERD (gastroesophageal reflux disease)    Hypertension    BPH (benign prostatic hyperplasia)    Acute diverticulitis 09/01/2021    Past Surgical History:  Procedure Laterality Date   ROTATOR CUFF REPAIR Right        Home Medications    Prior to Admission medications   Medication Sig Start Date End Date Taking? Authorizing Provider  albuterol (VENTOLIN HFA) 108 (90 Base) MCG/ACT inhaler Inhale 1-2 puffs into the lungs every 6 (six) hours as needed for wheezing or shortness of breath. 01/27/23  Yes Raylene Everts, MD  azithromycin (ZITHROMAX Z-PAK) 250 MG tablet Take two pills today followed by one a day until gone 01/27/23  Yes Raylene Everts, MD  chlorpheniramine-HYDROcodone (TUSSIONEX) 10-8 MG/5ML Take 5 mLs by mouth every 12 (twelve) hours as needed for cough. 01/27/23  Yes Raylene Everts, MD  furosemide (LASIX) 20 MG tablet Take 20 mg by mouth daily. 01/17/23  Yes [provider]  oseltamivir (TAMIFLU) 75 MG capsule Take 1 capsule (75 mg total) by mouth every 12 (twelve)  hours. 01/27/23  Yes Raylene Everts, MD  alfuzosin (UROXATRAL) 10 MG 24 hr tablet Take 10 mg by mouth daily with breakfast.    [provider]  lansoprazole (PREVACID) 15 MG capsule Take 15 mg by mouth daily at 12 noon.    [provider]  levothyroxine (SYNTHROID) 175 MCG tablet Take 175 mcg by mouth daily before breakfast.     [provider]  olmesartan-hydrochlorothiazide (BENICAR HCT) 40-25 MG tablet Take 1 tablet by mouth daily.    [provider]    Family History Family History  Problem Relation Age of Onset   Cancer Mother    Healthy Father     Social History Social History   Tobacco Use   Smoking status: Never   Smokeless tobacco: Never  Vaping Use   Vaping Use: Never used  Substance Use Topics   Alcohol use: Yes    Comment: socially   Drug use: Never     Allergies   Patient has no known allergies.   Review of Systems Review of Systems   Physical Exam Triage Vital Signs ED Triage Vitals  Enc Vitals Group     BP 01/27/23 1327 (!) 152/94     Pulse Rate 01/27/23 1327 (!) 113     Resp 01/27/23 1327 18  Temp 01/27/23 1327 (!) 100.9 F (38.3 C)     Temp Source 01/27/23 1327 Oral     SpO2 01/27/23 1327 97 %     Weight 01/27/23 1329 238 lb (108 kg)     Height 01/27/23 1329 6' (1.829 m)     Head Circumference --      Peak Flow --      Pain Score 01/27/23 1328 10     Pain Loc --      Pain Edu? --      Excl. in [place]? --    No data found.  Updated Vital Signs BP (!) 152/94 (BP Location: Left Arm)   Pulse (!) 113   Temp 98.9 F (37.2 C) (Oral)   Resp 18   Ht 6' (1.829 m)   Wt 108 kg   SpO2 97%   BMI 32.28 kg/m      Physical Exam Constitutional:      General: He is not in acute distress.    Appearance: He is well-developed. He is ill-appearing.  HENT:     Head: Normocephalic and atraumatic.     Right Ear: Tympanic membrane and ear canal normal.     Left Ear: Tympanic membrane and ear canal normal.      Nose: Congestion present.     Mouth/Throat:     Mouth: Mucous membranes are moist.     Pharynx: Posterior oropharyngeal erythema present.  Eyes:     Conjunctiva/sclera: Conjunctivae normal.     Pupils: Pupils are equal, round, and reactive to light.  Cardiovascular:     Rate and Rhythm: Normal rate and regular rhythm.     Heart sounds: Normal heart sounds.  Pulmonary:     Effort: Pulmonary effort is normal. No respiratory distress.     Breath sounds: Wheezing and rhonchi present.  Abdominal:     General: There is no distension.     Palpations: Abdomen is soft.  Musculoskeletal:        General: Normal range of motion.     Cervical back: Normal range of motion.  Lymphadenopathy:     Cervical: Cervical adenopathy present.  Skin:    General: Skin is warm and dry.  Neurological:     Mental Status: He is alert.  Psychiatric:        Mood and Affect: Mood normal.        Behavior: Behavior normal.    Patient was given a treatment of albuterol and nebulizer.  It improved his ease of respirations but he still has wheezing and rales bilaterally.  Few central rhonchi.  UC Treatments / Results  Labs (all labs ordered are listed, but only abnormal results are displayed) Labs Reviewed  POCT INFLUENZA A/B - Abnormal; Notable for the following components:      Result Value   Influenza A, POC Positive (*)    All other components within normal limits    EKG   Radiology DG Chest 2 View  Result Date: 01/27/2023 CLINICAL DATA:  Cough and congestion x 3 days, tested positive for flu today at UC, fevercough AND wheezing w/ fever EXAM: CHEST - 2 VIEW COMPARISON:  None Available. FINDINGS: Normal mediastinum and cardiac silhouette. Normal pulmonary vasculature. No evidence of effusion, infiltrate, or pneumothorax. No acute bony abnormality. IMPRESSION: No acute cardiopulmonary process. Electronically Signed   By: Suzy Bouchard M.D.   On: 01/27/2023 14:56    Procedures Procedures (including  critical care time)  Medications Ordered in UC Medications  acetaminophen (  TYLENOL) tablet 650 mg (650 mg Oral Given 01/27/23 1341)  albuterol (PROVENTIL) (2.5 MG/3ML) 0.083% nebulizer solution 2.5 mg (2.5 mg Nebulization Given 01/27/23 1343)  ketorolac (TORADOL) 30 MG/ML injection 30 mg (30 mg Intramuscular Given 01/27/23 1513)    Initial Impression / Assessment and Plan / UC Course  I have reviewed the triage vital signs and the nursing notes.  Pertinent labs & imaging results that were available during my care of the patient were reviewed by me and considered in my medical decision making (see chart for details).   At first I thought his x-ray may have some pneumonia, I discussed with the radiologist Dr. Leonia Reeves.  He states that it is congested but not consolidated.  Patient has asthma exacerbation caused by influenza.  Discussed treatment and prevention of influenza with Tamiflu. Final Clinical Impressions(s) / UC Diagnoses   Final diagnoses:  Influenza A  Mild intermittent asthma with acute exacerbation     Discharge Instructions      The shot of Toradol will kick in in 30 to 40 minutes and help you sleep.  It is a pain medicine. I have prescribed Tamiflu to take 2 times a day for 5 days.  This is an antiviral medicine to reduce your flu symptoms I have prescribed a Z-Pak to take as an antibiotic.  This is in addition to prevent bacterial infection I have prescribed Tussionex which is a stronger cough medicine with hydrocodone.  You can use this every 12 hours I also refilled your inhaler in case you have wheezing Home to rest.  Drink lots of fluids Call for problems     ED Prescriptions     Medication Sig Dispense Auth. Provider   oseltamivir (TAMIFLU) 75 MG capsule Take 1 capsule (75 mg total) by mouth every 12 (twelve) hours. 10 capsule Raylene Everts, MD   albuterol (VENTOLIN HFA) 108 (90 Base) MCG/ACT inhaler Inhale 1-2 puffs into the lungs every 6 (six) hours as  needed for wheezing or shortness of breath. 18 g Raylene Everts, MD   azithromycin (ZITHROMAX Z-PAK) 250 MG tablet Take two pills today followed by one a day until gone 6 tablet Raylene Everts, MD   chlorpheniramine-HYDROcodone (TUSSIONEX) 10-8 MG/5ML Take 5 mLs by mouth every 12 (twelve) hours as needed for cough. 115 mL Raylene Everts, MD      PDMP not reviewed this encounter.   Raylene Everts, MD 01/27/23 [phone]

## 2023-01-28 ENCOUNTER — Telehealth: Payer: Self-pay | Admitting: *Deleted

## 2023-01-28 NOTE — Telephone Encounter (Signed)
Callback: Patient reports he is a little better than he was yesterday. He continues to cough and is very sore. I encouraged rest and continuing medications. It will take several days for symptoms to resolve. Call back with any further questions.

## 2023-01-29 ENCOUNTER — Encounter: Payer: Self-pay | Admitting: Emergency Medicine

## 2023-01-29 ENCOUNTER — Ambulatory Visit
Admission: EM | Admit: 2023-01-29 | Discharge: 2023-01-29 | Disposition: A | Discharge status: Home / Self Care | Payer: BC Managed Care – PPO | Attending: Family Medicine | Admitting: Family Medicine

## 2023-01-29 DIAGNOSIS — R062 Wheezing: Secondary | ICD-10-CM

## 2023-01-29 DIAGNOSIS — R059 Cough, unspecified: Secondary | ICD-10-CM

## 2023-01-29 MED ORDER — METHYLPREDNISOLONE SODIUM SUCC 125 MG IJ SOLR
125.0000 mg | Freq: Once | INTRAMUSCULAR | Status: AC
  Administered 2023-01-29: 125 mg via INTRAMUSCULAR

## 2023-01-29 MED ORDER — AMOXICILLIN-POT CLAVULANATE 875-125 MG PO TABS: 1.0000 | ORAL_TABLET | Freq: Two times a day (BID) | ORAL | 0 refills | Status: AC

## 2023-01-29 MED ORDER — PREDNISONE 10 MG (21) PO TBPK: ORAL_TABLET | Freq: Every day | ORAL | 0 refills | Status: DC

## 2023-01-29 NOTE — ED Provider Notes (Signed)
[facility]    CSN: [id number] Arrival date & time: 01/29/23  0849      History   Chief Complaint Chief Complaint  Patient presents with   Wheezing    HPI Wayne Gross is a 62 y.o. male.   HPI Pleasant 62 year old male presents with wheezing for 3 days.  Patient was evaluated here 2 days ago on Sunday, 01/27/2023 for influenza A and asthma with exacerbation.  Patient reports taking medication as previously prescribed, reports using albuterol 4 times per day; however, none today.  PMH significant for HTN, diverticulitis, and thyroid disease.  Patient is accompanied by his wife this morning.  Patient reports that he is scheduled for CT of chest on Friday, 02/01/23 for left-sided chest/abdominal pain.  Past Medical History:  Diagnosis Date   Asthma    Diverticulitis    GERD (gastroesophageal reflux disease)    Hypertension    Thyroid disease     Patient Active Problem List   Diagnosis Date Noted   Perforated diverticulum 09/06/2021   Sepsis ([place]) 09/02/2021   Thyroid disease    GERD (gastroesophageal reflux disease)    Hypertension    BPH (benign prostatic hyperplasia)    Acute diverticulitis 09/01/2021    Past Surgical History:  Procedure Laterality Date   ROTATOR CUFF REPAIR Right        Home Medications    Prior to Admission medications   Medication Sig Start Date End Date Taking? Authorizing Provider  amoxicillin-clavulanate (AUGMENTIN) 875-125 MG tablet Take 1 tablet by mouth 2 (two) times daily for 10 days. 01/29/23 02/08/23 Yes Eliezer Lofts, FNP  predniSONE (STERAPRED UNI-PAK 21 TAB) 10 MG (21) TBPK tablet Take by mouth daily. Take 6 tabs by mouth daily  for 2 days, then 5 tabs for 2 days, then 4 tabs for 2 days, then 3 tabs for 2 days, 2 tabs for 2 days, then 1 tab by mouth daily for 2 days 01/29/23  Yes Eliezer Lofts, FNP  albuterol (VENTOLIN HFA) 108 (90 Base) MCG/ACT inhaler Inhale 1-2 puffs into the lungs every 6 (six) hours as needed for  wheezing or shortness of breath. 01/27/23   Raylene Everts, MD  alfuzosin (UROXATRAL) 10 MG 24 hr tablet Take 10 mg by mouth daily with breakfast.    [provider]  azithromycin (ZITHROMAX Z-PAK) 250 MG tablet Take two pills today followed by one a day until gone 01/27/23   Raylene Everts, MD  chlorpheniramine-HYDROcodone (TUSSIONEX) 10-8 MG/5ML Take 5 mLs by mouth every 12 (twelve) hours as needed for cough. 01/27/23   Raylene Everts, MD  furosemide (LASIX) 20 MG tablet Take 20 mg by mouth daily. 01/17/23   [provider]  lansoprazole (PREVACID) 15 MG capsule Take 15 mg by mouth daily at 12 noon.    [provider]  levothyroxine (SYNTHROID) 175 MCG tablet Take 175 mcg by mouth daily before breakfast.     [provider]  olmesartan-hydrochlorothiazide (BENICAR HCT) 40-25 MG tablet Take 1 tablet by mouth daily.    [provider]  oseltamivir (TAMIFLU) 75 MG capsule Take 1 capsule (75 mg total) by mouth every 12 (twelve) hours. 01/27/23   Raylene Everts, MD    Family History Family History  Problem Relation Age of Onset   Cancer Mother    Healthy Father     Social History Social History   Tobacco Use   Smoking status: Never   Smokeless tobacco: Never  Vaping Use  Vaping Use: Never used  Substance Use Topics   Alcohol use: Yes    Comment: socially   Drug use: Never     Allergies   Patient has no known allergies.   Review of Systems Review of Systems  Constitutional:  Positive for fatigue.  Respiratory:  Positive for cough and wheezing.   All other systems reviewed and are negative.    Physical Exam Triage Vital Signs ED Triage Vitals  Enc Vitals Group     BP 01/29/23 0900 (!) 153/92     Pulse Rate 01/29/23 0900 86     Resp 01/29/23 0900 18     Temp 01/29/23 0900 98.2 F (36.8 C)     Temp Source 01/29/23 0900 Oral     SpO2 01/29/23 0900 97 %     Weight 01/29/23 0901 238 lb 0.3 oz (108 kg)     Height --       Head Circumference --      Peak Flow --      Pain Score 01/29/23 0900 10     Pain Loc --      Pain Edu? --      Excl. in [place]? --    No data found.  Updated Vital Signs BP (!) 153/92 (BP Location: Left Arm)   Pulse 86   Temp 98.2 F (36.8 C) (Oral)   Resp 18   Wt 238 lb 0.3 oz (108 kg)   SpO2 97%   BMI 32.28 kg/m    Physical Exam Vitals and nursing note reviewed.  Constitutional:      Appearance: He is normal weight. He is ill-appearing.  HENT:     Head: Normocephalic and atraumatic.     Right Ear: Tympanic membrane, ear canal and external ear normal.     Left Ear: Tympanic membrane, ear canal and external ear normal.     Mouth/Throat:     Mouth: Mucous membranes are moist.     Pharynx: Oropharynx is clear.  Eyes:     Extraocular Movements: Extraocular movements intact.     Conjunctiva/sclera: Conjunctivae normal.     Pupils: Pupils are equal, round, and reactive to light.  Cardiovascular:     Rate and Rhythm: Normal rate and regular rhythm.     Pulses: Normal pulses.     Heart sounds: Normal heart sounds.  Pulmonary:     Effort: Pulmonary effort is normal.     Breath sounds: Wheezing present. No rhonchi or rales.     Comments: Mild inspiratory wheezing noted throughout, diminished breath sounds bibasilarly, infrequent nonproductive cough Musculoskeletal:        General: Normal range of motion.     Cervical back: Normal range of motion and neck supple. No tenderness.  Lymphadenopathy:     Cervical: No cervical adenopathy.  Skin:    General: Skin is warm and dry.  Neurological:     General: No focal deficit present.     Mental Status: He is alert and oriented to person, place, and time. Mental status is at baseline.      UC Treatments / Results  Labs (all labs ordered are listed, but only abnormal results are displayed) Labs Reviewed - No data to display  EKG   Radiology DG Chest 2 View  Result Date: 01/27/2023 CLINICAL DATA:  Cough and  congestion x 3 days, tested positive for flu today at UC, fevercough AND wheezing w/ fever EXAM: CHEST - 2 VIEW COMPARISON:  None Available. FINDINGS: Normal mediastinum  and cardiac silhouette. Normal pulmonary vasculature. No evidence of effusion, infiltrate, or pneumothorax. No acute bony abnormality. IMPRESSION: No acute cardiopulmonary process. Electronically Signed   By: Suzy Bouchard M.D.   On: 01/27/2023 14:56    Procedures Procedures (including critical care time)  Medications Ordered in UC Medications  methylPREDNISolone sodium succinate (SOLU-MEDROL) 125 mg/2 mL injection 125 mg (125 mg Intramuscular Given 01/29/23 0950)    Initial Impression / Assessment and Plan / UC Course  I have reviewed the triage vital signs and the nursing notes.  Pertinent labs & imaging results that were available during my care of the patient were reviewed by me and considered in my medical decision making (see chart for details).     MDM: 1.  Cough, unspecified type-Rx'd Augmentin 875 mg twice daily for 10 days, treating empirically for community-acquired pneumonia given symptoms of cough/fatigue and exam today.  Patient is scheduled to have CT of chest performed this Friday, 02/01/2023, patient currently finishing previously prescribed Zithromax and Tamiflu over the next 2 days. 2.  Sharl Ma Solu-Medrol 125 mg given once in clinic prior to discharge, Rx'd Sterapred Unipak beginning taper at 60 mg for 2 days tapering to 10 mg over 10 days. Instructed patient to take medications as directed with food to completion.  Advised patient to take prednisone with first dose of Augmentin for the next 10 days.  Encouraged patient to increase daily water intake to 64 ounces per day while taking these medications.  Advised patient to rest for the rest of this week and to return to work on Monday, 02/04/2023.  Advised if symptoms worsen and/or unresolved please follow-up with PCP or here for further evaluation.  Patient  discharged home, hemodynamically stable. Final Clinical Impressions(s) / UC Diagnoses   Final diagnoses:  Wheezing  Cough, unspecified type     Discharge Instructions      Instructed patient to take medications as directed with food to completion.  Advised patient to take prednisone with first dose of Augmentin for the next 10 days.  Encouraged patient to increase daily water intake to 64 ounces per day while taking these medications.  Advised patient to rest for the rest of this week and to return to work on Monday, 02/04/2023.  Advised if symptoms worsen and/or unresolved please follow-up with PCP or here for further evaluation.     ED Prescriptions     Medication Sig Dispense Auth. Provider   predniSONE (STERAPRED UNI-PAK 21 TAB) 10 MG (21) TBPK tablet Take by mouth daily. Take 6 tabs by mouth daily  for 2 days, then 5 tabs for 2 days, then 4 tabs for 2 days, then 3 tabs for 2 days, 2 tabs for 2 days, then 1 tab by mouth daily for 2 days 42 tablet Eliezer Lofts, FNP   amoxicillin-clavulanate (AUGMENTIN) 875-125 MG tablet Take 1 tablet by mouth 2 (two) times daily for 10 days. 20 tablet Eliezer Lofts, FNP      PDMP not reviewed this encounter.   Eliezer Lofts, [place] 01/29/23 1025

## 2023-01-29 NOTE — Discharge Instructions (Addendum)
Instructed patient to take medications as directed with food to completion.  Advised patient to take prednisone with first dose of Augmentin for the next 10 days.  Encouraged patient to increase daily water intake to 64 ounces per day while taking these medications.  Advised patient to rest for the rest of this week and to return to work on Monday, 02/04/2023.  Advised if symptoms worsen and/or unresolved please follow-up with PCP or here for further evaluation.

## 2023-01-29 NOTE — ED Triage Notes (Addendum)
Pt was seen on 3/17 - dx w/ flu  Still having  wheezing issues- worse when laying down  Albuterol 4 times a day - none today  Cough is worse at night Steroids have helped in the past  Pt is scheduled for a chest CT on 3/22 for left sided chest/abdominal pain  Here w/ wife  Fevers have resolved  Pt has been diaphoretic at home

## 2023-01-30 ENCOUNTER — Telehealth: Payer: Self-pay | Admitting: Emergency Medicine

## 2023-01-30 NOTE — Telephone Encounter (Signed)
Spoke with patient states that he is doing much better.  The steroids given have really helped him.  Patient will continue current treatment and follow up as needed.  Patient was very appreciative for our services.

## 2023-02-01 DIAGNOSIS — K5730 Diverticulosis of large intestine without perforation or abscess without bleeding: Secondary | ICD-10-CM | POA: Diagnosis not present

## 2023-02-01 DIAGNOSIS — K7689 Other specified diseases of liver: Secondary | ICD-10-CM | POA: Diagnosis not present

## 2023-02-01 DIAGNOSIS — N403 Nodular prostate with lower urinary tract symptoms: Secondary | ICD-10-CM | POA: Diagnosis not present

## 2023-02-01 DIAGNOSIS — K760 Fatty (change of) liver, not elsewhere classified: Secondary | ICD-10-CM | POA: Diagnosis not present

## 2023-02-01 DIAGNOSIS — K4090 Unilateral inguinal hernia, without obstruction or gangrene, not specified as recurrent: Secondary | ICD-10-CM | POA: Diagnosis not present

## 2023-02-11 DIAGNOSIS — N138 Other obstructive and reflux uropathy: Secondary | ICD-10-CM | POA: Diagnosis not present

## 2023-02-11 DIAGNOSIS — E6609 Other obesity due to excess calories: Secondary | ICD-10-CM | POA: Diagnosis not present

## 2023-02-11 DIAGNOSIS — Z6832 Body mass index (BMI) 32.0-32.9, adult: Secondary | ICD-10-CM | POA: Diagnosis not present

## 2023-02-11 DIAGNOSIS — N403 Nodular prostate with lower urinary tract symptoms: Secondary | ICD-10-CM | POA: Diagnosis not present

## 2023-03-13 DIAGNOSIS — I10 Essential (primary) hypertension: Secondary | ICD-10-CM | POA: Diagnosis not present

## 2023-03-13 DIAGNOSIS — R972 Elevated prostate specific antigen [PSA]: Secondary | ICD-10-CM | POA: Diagnosis not present

## 2023-03-13 DIAGNOSIS — Z0000 Encounter for general adult medical examination without abnormal findings: Secondary | ICD-10-CM | POA: Diagnosis not present

## 2023-03-15 DIAGNOSIS — R739 Hyperglycemia, unspecified: Secondary | ICD-10-CM | POA: Diagnosis not present

## 2023-03-15 DIAGNOSIS — I10 Essential (primary) hypertension: Secondary | ICD-10-CM | POA: Diagnosis not present

## 2023-03-15 DIAGNOSIS — E669 Obesity, unspecified: Secondary | ICD-10-CM | POA: Diagnosis not present

## 2023-03-15 DIAGNOSIS — G4733 Obstructive sleep apnea (adult) (pediatric): Secondary | ICD-10-CM | POA: Diagnosis not present

## 2023-03-22 DIAGNOSIS — R350 Frequency of micturition: Secondary | ICD-10-CM | POA: Diagnosis not present

## 2023-03-22 DIAGNOSIS — N402 Nodular prostate without lower urinary tract symptoms: Secondary | ICD-10-CM | POA: Diagnosis not present

## 2023-03-22 DIAGNOSIS — N401 Enlarged prostate with lower urinary tract symptoms: Secondary | ICD-10-CM | POA: Diagnosis not present

## 2023-04-05 DIAGNOSIS — G4733 Obstructive sleep apnea (adult) (pediatric): Secondary | ICD-10-CM | POA: Diagnosis not present

## 2023-04-07 DIAGNOSIS — G4733 Obstructive sleep apnea (adult) (pediatric): Secondary | ICD-10-CM | POA: Diagnosis not present

## 2023-05-15 DIAGNOSIS — K5730 Diverticulosis of large intestine without perforation or abscess without bleeding: Secondary | ICD-10-CM | POA: Diagnosis not present

## 2023-05-15 DIAGNOSIS — N402 Nodular prostate without lower urinary tract symptoms: Secondary | ICD-10-CM | POA: Diagnosis not present

## 2023-05-17 DIAGNOSIS — N401 Enlarged prostate with lower urinary tract symptoms: Secondary | ICD-10-CM | POA: Diagnosis not present

## 2023-05-17 DIAGNOSIS — R350 Frequency of micturition: Secondary | ICD-10-CM | POA: Diagnosis not present

## 2023-05-27 DIAGNOSIS — N403 Nodular prostate with lower urinary tract symptoms: Secondary | ICD-10-CM | POA: Diagnosis not present

## 2023-05-27 DIAGNOSIS — M5116 Intervertebral disc disorders with radiculopathy, lumbar region: Secondary | ICD-10-CM | POA: Diagnosis not present

## 2023-06-14 DIAGNOSIS — N4289 Other specified disorders of prostate: Secondary | ICD-10-CM | POA: Diagnosis not present

## 2023-06-14 DIAGNOSIS — R972 Elevated prostate specific antigen [PSA]: Secondary | ICD-10-CM | POA: Diagnosis not present

## 2023-06-14 DIAGNOSIS — N411 Chronic prostatitis: Secondary | ICD-10-CM | POA: Diagnosis not present

## 2023-06-17 DIAGNOSIS — R972 Elevated prostate specific antigen [PSA]: Secondary | ICD-10-CM | POA: Diagnosis not present

## 2023-06-17 DIAGNOSIS — K649 Unspecified hemorrhoids: Secondary | ICD-10-CM | POA: Diagnosis not present

## 2023-06-21 ENCOUNTER — Ambulatory Visit
Admission: EM | Admit: 2023-06-21 | Discharge: 2023-06-21 | Disposition: A | Discharge status: Home / Self Care | Payer: BC Managed Care – PPO | Attending: Family Medicine | Admitting: Family Medicine

## 2023-06-21 DIAGNOSIS — M62830 Muscle spasm of back: Secondary | ICD-10-CM | POA: Diagnosis not present

## 2023-06-21 DIAGNOSIS — M546 Pain in thoracic spine: Secondary | ICD-10-CM

## 2023-06-21 MED ORDER — METHOCARBAMOL 500 MG PO TABS: 500.0000 mg | ORAL_TABLET | Freq: Three times a day (TID) | ORAL | 0 refills | Status: DC | PRN

## 2023-06-21 MED ORDER — METHYLPREDNISOLONE SODIUM SUCC 125 MG IJ SOLR: 125.0000 mg | Freq: Once | INTRAMUSCULAR | Status: DC

## 2023-06-21 MED ORDER — PREDNISONE 10 MG (21) PO TBPK: ORAL_TABLET | Freq: Every day | ORAL | 0 refills | Status: DC

## 2023-06-21 NOTE — Discharge Instructions (Addendum)
Advised patient to take medication as directed with food to completion.  Advised may use Robaxin daily or as needed for accompanying muscle spasms of the back.  Encouraged to increase daily water intake to 64 ounces per day while taking these medications.  Advised if symptoms worsen and/or unresolved please follow-up with PCP or here for further evaluation.

## 2023-06-21 NOTE — ED Provider Notes (Signed)
[facility]    CSN: [id number] Arrival date & time: 06/21/23  1618      History   Chief Complaint Chief Complaint  Patient presents with   Back Pain    HPI Wayne Gross is a 62 y.o. male.   HPI Very pleasant 62 year old male presents with mid back pain x 5 days.  PMH significant for obesity, HTN, and thyroid disease.  Patient is accompanied by his wife this evening.  Past Medical History:  Diagnosis Date   Asthma    Diverticulitis    GERD (gastroesophageal reflux disease)    Hypertension    Thyroid disease     Patient Active Problem List   Diagnosis Date Noted   Perforated diverticulum 09/06/2021   Sepsis (HCC) 09/02/2021   Thyroid disease    GERD (gastroesophageal reflux disease)    Hypertension    BPH (benign prostatic hyperplasia)    Acute diverticulitis 09/01/2021    Past Surgical History:  Procedure Laterality Date   ROTATOR CUFF REPAIR Right        Home Medications    Prior to Admission medications   Medication Sig Start Date End Date Taking? Authorizing Provider  methocarbamol (ROBAXIN) 500 MG tablet Take 1 tablet (500 mg total) by mouth 3 (three) times daily as needed. 06/21/23  Yes Trevor Iha, FNP  predniSONE (STERAPRED UNI-PAK 21 TAB) 10 MG (21) TBPK tablet Take by mouth daily. Take 6 tabs by mouth daily  for 2 days, then 5 tabs for 2 days, then 4 tabs for 2 days, then 3 tabs for 2 days, 2 tabs for 2 days, then 1 tab by mouth daily for 2 days 06/21/23  Yes Trevor Iha, FNP  albuterol (VENTOLIN HFA) 108 (90 Base) MCG/ACT inhaler Inhale 1-2 puffs into the lungs every 6 (six) hours as needed for wheezing or shortness of breath. 01/27/23   Eustace Moore, MD  alfuzosin (UROXATRAL) 10 MG 24 hr tablet Take 10 mg by mouth daily with breakfast.    [provider]  azithromycin (ZITHROMAX Z-PAK) 250 MG tablet Take two pills today followed by one a day until gone 01/27/23   Eustace Moore, MD  chlorpheniramine-HYDROcodone  (TUSSIONEX) 10-8 MG/5ML Take 5 mLs by mouth every 12 (twelve) hours as needed for cough. 01/27/23   Eustace Moore, MD  furosemide (LASIX) 20 MG tablet Take 20 mg by mouth daily. 01/17/23   [provider]  lansoprazole (PREVACID) 15 MG capsule Take 15 mg by mouth daily at 12 noon.    [provider]  levothyroxine (SYNTHROID) 175 MCG tablet Take 175 mcg by mouth daily before breakfast.     [provider]  olmesartan-hydrochlorothiazide (BENICAR HCT) 40-25 MG tablet Take 1 tablet by mouth daily.    [provider]  oseltamivir (TAMIFLU) 75 MG capsule Take 1 capsule (75 mg total) by mouth every 12 (twelve) hours. 01/27/23   Eustace Moore, MD    Family History Family History  Problem Relation Age of Onset   Cancer Mother    Healthy Father     Social History Social History   Tobacco Use   Smoking status: Never   Smokeless tobacco: Never  Vaping Use   Vaping status: Never Used  Substance Use Topics   Alcohol use: Yes    Comment: socially   Drug use: Never     Allergies   Patient has no known allergies.   Review of Systems Review of Systems  Musculoskeletal:  Positive  for back pain.  All other systems reviewed and are negative.    Physical Exam Triage Vital Signs ED Triage Vitals  Encounter Vitals Group     BP 06/21/23 1638 (!) 175/114     Systolic BP Percentile --      Diastolic BP Percentile --      Pulse Rate 06/21/23 1638 (!) 105     Resp 06/21/23 1638 17     Temp 06/21/23 1638 97.9 F (36.6 C)     Temp Source 06/21/23 1638 Oral     SpO2 06/21/23 1638 97 %     Weight --      Height --      Head Circumference --      Peak Flow --      Pain Score 06/21/23 1637 9     Pain Loc --      Pain Education --      Exclude from Growth Chart --    No data found.  Updated Vital Signs BP (!) 175/114 (BP Location: Right Arm)   Pulse (!) 105   Temp 97.9 F (36.6 C) (Oral)   Resp 17   SpO2 97%    Physical Exam Vitals  and nursing note reviewed.  Constitutional:      Appearance: Normal appearance. He is normal weight.  HENT:     Head: Normocephalic and atraumatic.     Mouth/Throat:     Mouth: Mucous membranes are moist.     Pharynx: Oropharynx is clear.  Eyes:     Extraocular Movements: Extraocular movements intact.     Conjunctiva/sclera: Conjunctivae normal.     Pupils: Pupils are equal, round, and reactive to light.  Cardiovascular:     Rate and Rhythm: Normal rate and regular rhythm.     Pulses: Normal pulses.     Heart sounds: Normal heart sounds.  Pulmonary:     Effort: Pulmonary effort is normal.     Breath sounds: Normal breath sounds. No wheezing, rhonchi or rales.  Musculoskeletal:        General: Normal range of motion.     Cervical back: Normal range of motion and neck supple.     Comments: Thoracic spine (left-sided superior aspects): TTP over spinous processes left-sided paraspinous muscles over T3-T6, no deformity noted  Skin:    General: Skin is warm and dry.  Neurological:     General: No focal deficit present.     Mental Status: He is alert and oriented to person, place, and time. Mental status is at baseline.  Psychiatric:        Mood and Affect: Mood normal.        Behavior: Behavior normal.        Thought Content: Thought content normal.      UC Treatments / Results  Labs (all labs ordered are listed, but only abnormal results are displayed) Labs Reviewed - No data to display  EKG   Radiology No results found.  Procedures Procedures (including critical care time)  Medications Ordered in UC Medications  methylPREDNISolone sodium succinate (SOLU-MEDROL) 125 mg/2 mL injection 125 mg (has no administration in time range)    Initial Impression / Assessment and Plan / UC Course  I have reviewed the triage vital signs and the nursing notes.  Pertinent labs & imaging results that were available during my care of the patient were reviewed by me and considered in  my medical decision making (see chart for details).     MDM:  1.  Acute left-sided thoracic back pain-IM Solu-Medrol 125 mg given once in clinic and prior to discharge, Rx'd Sterapred Unipak (tapering from 60 mg to 10 mg over 10 days); 2.  Muscle spasm of back-Rx'd Robaxin 500 mg tablet: Take 1 tablet 3 times daily, as needed for accompanying muscle spasms of back. Advised patient to take medication as directed with food to completion.  Advised may use Robaxin daily or as needed for accompanying muscle spasms of the back.  Encouraged to increase daily water intake to 64 ounces per day while taking these medications.  Advised if symptoms worsen and/or unresolved please follow-up with PCP or here for further evaluation.  Patient discharged home, hemodynamically stable.  Final Clinical Impressions(s) / UC Diagnoses   Final diagnoses:  Acute left-sided thoracic back pain  Muscle spasm of back     Discharge Instructions      Advised patient to take medication as directed with food to completion.  Advised may use Robaxin daily or as needed for accompanying muscle spasms of the back.  Encouraged to increase daily water intake to 64 ounces per day while taking these medications.  Advised if symptoms worsen and/or unresolved please follow-up with PCP or here for further evaluation.     ED Prescriptions     Medication Sig Dispense Auth. Provider   predniSONE (STERAPRED UNI-PAK 21 TAB) 10 MG (21) TBPK tablet Take by mouth daily. Take 6 tabs by mouth daily  for 2 days, then 5 tabs for 2 days, then 4 tabs for 2 days, then 3 tabs for 2 days, 2 tabs for 2 days, then 1 tab by mouth daily for 2 days 42 tablet Trevor Iha, FNP   methocarbamol (ROBAXIN) 500 MG tablet Take 1 tablet (500 mg total) by mouth 3 (three) times daily as needed. 30 tablet Trevor Iha, FNP      PDMP not reviewed this encounter.   Trevor Iha, FNP 06/21/23 1827

## 2023-06-21 NOTE — ED Triage Notes (Signed)
Pt presents with c/o mid back pain x 5 days.   Pt states he has taken advil and tylenol with no relief. Reports the pain is a tabbing pain and has limited ROM when lifting the lt arm.

## 2023-07-05 DIAGNOSIS — R972 Elevated prostate specific antigen [PSA]: Secondary | ICD-10-CM | POA: Diagnosis not present

## 2023-07-05 DIAGNOSIS — R350 Frequency of micturition: Secondary | ICD-10-CM | POA: Diagnosis not present

## 2023-07-05 DIAGNOSIS — N529 Male erectile dysfunction, unspecified: Secondary | ICD-10-CM | POA: Diagnosis not present

## 2023-07-05 DIAGNOSIS — N401 Enlarged prostate with lower urinary tract symptoms: Secondary | ICD-10-CM | POA: Diagnosis not present

## 2023-08-14 DIAGNOSIS — G4733 Obstructive sleep apnea (adult) (pediatric): Secondary | ICD-10-CM | POA: Diagnosis not present

## 2023-08-15 ENCOUNTER — Encounter: Payer: Self-pay | Admitting: Emergency Medicine

## 2023-08-15 ENCOUNTER — Ambulatory Visit
Admission: EM | Admit: 2023-08-15 | Discharge: 2023-08-15 | Disposition: A | Discharge status: Home / Self Care | Payer: BC Managed Care – PPO | Attending: Family Medicine | Admitting: Family Medicine

## 2023-08-15 DIAGNOSIS — S29012A Strain of muscle and tendon of back wall of thorax, initial encounter: Secondary | ICD-10-CM

## 2023-08-15 MED ORDER — KETOROLAC TROMETHAMINE 30 MG/ML IJ SOLN
30.0000 mg | Freq: Once | INTRAMUSCULAR | Status: AC
  Administered 2023-08-15: 30 mg via INTRAMUSCULAR

## 2023-08-15 MED ORDER — PREDNISONE 10 MG (21) PO TBPK: ORAL_TABLET | Freq: Every day | ORAL | 0 refills | Status: DC

## 2023-08-15 NOTE — ED Triage Notes (Signed)
Patient states that he was doing some yard work this weekend and began having back pain.  The pain is located in between his shoulder blades.  No apparent injury.  History of back pain.  Been taken Advil.  Pain described as a stabbing pain.

## 2023-08-15 NOTE — ED Provider Notes (Signed)
[name]    CSN: [id number] Arrival date & time: 08/15/23  4098      History   Chief Complaint Chief Complaint  Patient presents with   Back Pain    HPI Wayne Gross is a 62 y.o. male.   Patient was seen here in August for thoracic strain.  He states that he has strained the same area again.  He did a lot of yard cleaning up after a storm recently.  A lot of pushing and pulling with his arms.  Now has pain on the left side of his thoracic area near the shoulder blade.  Tylenol is not helping.  He still has methocarbamol from his first injury and this is not helping, he does not like taking it (caution drowsiness).  He states he recovered quickly been treated with steroids at his last visit.  He also complains of increased pedal edema.  No shortness of breath, chest pain or dyspnea on exertion.  He is advised to follow-up with his PCP regarding his edema    Past Medical History:  Diagnosis Date   Asthma    Diverticulitis    GERD (gastroesophageal reflux disease)    Hypertension    Thyroid disease     Patient Active Problem List   Diagnosis Date Noted   Perforated diverticulum 09/06/2021   Sepsis (HCC) 09/02/2021   Thyroid disease    GERD (gastroesophageal reflux disease)    Hypertension    BPH (benign prostatic hyperplasia)    Acute diverticulitis 09/01/2021    Past Surgical History:  Procedure Laterality Date   ROTATOR CUFF REPAIR Right        Home Medications    Prior to Admission medications   Medication Sig Start Date End Date Taking? Authorizing Provider  albuterol (VENTOLIN HFA) 108 (90 Base) MCG/ACT inhaler Inhale 1-2 puffs into the lungs every 6 (six) hours as needed for wheezing or shortness of breath. 01/27/23  Yes Eustace Moore, MD  alfuzosin (UROXATRAL) 10 MG 24 hr tablet Take 10 mg by mouth daily with breakfast.   Yes [provider]  furosemide (LASIX) 20 MG tablet Take 20 mg by mouth daily. 01/17/23  Yes [provider]  lansoprazole (PREVACID) 15 MG capsule Take 15 mg by mouth daily at 12 noon.   Yes [provider]  levothyroxine (SYNTHROID) 175 MCG tablet Take 175 mcg by mouth daily before breakfast.    Yes [provider]  olmesartan-hydrochlorothiazide (BENICAR HCT) 40-25 MG tablet Take 1 tablet by mouth daily.   Yes [provider]  predniSONE (STERAPRED UNI-PAK 21 TAB) 10 MG (21) TBPK tablet Take by mouth daily. Take 6 tabs by mouth daily  for 2 days, then 5 tabs for 2 days, then 4 tabs for 2 days, then 3 tabs for 2 days, 2 tabs for 2 days, then 1 tab by mouth daily for 2 days 08/15/23   Eustace Moore, MD    Family History Family History  Problem Relation Age of Onset   Cancer Mother    Healthy Father     Social History Social History   Tobacco Use   Smoking status: Never   Smokeless tobacco: Never  Vaping Use   Vaping status: Never Used  Substance Use Topics   Alcohol use: Yes    Comment: socially   Drug use: Never     Allergies   Patient has no known allergies.   Review of Systems Review of Systems See HPI  See HPI Physical Exam Triage Vital Signs ED Triage Vitals  Encounter Vitals Group     BP 08/15/23 0950 (!) 147/79     Systolic BP Percentile --      Diastolic BP Percentile --      Pulse Rate 08/15/23 0950 81     Resp 08/15/23 0950 18     Temp 08/15/23 0950 98.5 F (36.9 C)     Temp Source 08/15/23 0950 Oral     SpO2 08/15/23 0950 96 %     Weight 08/15/23 0951 240 lb (108.9 kg)     Height 08/15/23 0951 6' (1.829 m)     Head Circumference --      Peak Flow --      Pain Score 08/15/23 0951 7     Pain Loc --      Pain Education --      Exclude from Growth Chart --    No data found.  Updated Vital Signs BP (!) 147/79 (BP Location: Right Arm)   Pulse 81   Temp 98.5 F (36.9 C) (Oral)   Resp 18   Ht 6' (1.829 m)   Wt 108.9 kg   SpO2 96%   BMI 32.55 kg/m   Physical Exam Constitutional:      General: He is not  in acute distress.    Appearance: He is well-developed.  HENT:     Head: Normocephalic and atraumatic.  Eyes:     Conjunctiva/sclera: Conjunctivae normal.     Pupils: Pupils are equal, round, and reactive to light.  Cardiovascular:     Rate and Rhythm: Normal rate.  Pulmonary:     Effort: Pulmonary effort is normal. No respiratory distress.  Abdominal:     General: There is no distension.     Palpations: Abdomen is soft.  Musculoskeletal:        General: Normal range of motion.     Cervical back: Normal range of motion.       Back:     Comments: Visible and palpable muscle spasm in the left rhomboid region.  No bony tenderness  Skin:    General: Skin is warm and dry.  Neurological:     Mental Status: He is alert.      UC Treatments / Results  Labs (all labs ordered are listed, but only abnormal results are displayed) Labs Reviewed - No data to display  EKG   Radiology No results found.  Procedures Procedures (including critical care time)  Medications Ordered in UC Medications  ketorolac (TORADOL) 30 MG/ML injection 30 mg (30 mg Intramuscular Given 08/15/23 1032)    Initial Impression / Assessment and Plan / UC Course  I have reviewed the triage vital signs and the nursing notes.  Pertinent labs & imaging results that were available during my care of the patient were reviewed by me and considered in my medical decision making (see chart for details).     Final Clinical Impressions(s) / UC Diagnoses   Final diagnoses:  Rhomboid muscle strain, initial encounter     Discharge Instructions      Use ice or heat to area Limit activity while back is painful Take the prednisone as directed Take muscle relaxer at bedtime  See your doctor regarding the swelling in your ankles.  He may want to increase your Lasix   ED Prescriptions     Medication Sig Dispense Auth. Provider   predniSONE (STERAPRED UNI-PAK 21 TAB) 10 MG (21) TBPK tablet Take by  mouth  daily. Take 6 tabs by mouth daily  for 2 days, then 5 tabs for 2 days, then 4 tabs for 2 days, then 3 tabs for 2 days, 2 tabs for 2 days, then 1 tab by mouth daily for 2 days 42 tablet Delton See Letta Pate, MD      PDMP not reviewed this encounter.   Eustace Moore, MD 08/15/23 [phone]

## 2023-08-15 NOTE — Discharge Instructions (Signed)
Use ice or heat to area Limit activity while back is painful Take the prednisone as directed Take muscle relaxer at bedtime  See your doctor regarding the swelling in your ankles.  He may want to increase your Lasix

## 2023-09-14 DIAGNOSIS — G4733 Obstructive sleep apnea (adult) (pediatric): Secondary | ICD-10-CM | POA: Diagnosis not present

## 2023-09-25 DIAGNOSIS — N4281 Prostatodynia syndrome: Secondary | ICD-10-CM | POA: Diagnosis not present

## 2023-09-25 DIAGNOSIS — R109 Unspecified abdominal pain: Secondary | ICD-10-CM | POA: Diagnosis not present

## 2023-09-25 DIAGNOSIS — Z9889 Other specified postprocedural states: Secondary | ICD-10-CM | POA: Diagnosis not present

## 2023-09-25 DIAGNOSIS — R1032 Left lower quadrant pain: Secondary | ICD-10-CM | POA: Diagnosis not present

## 2023-09-27 DIAGNOSIS — N320 Bladder-neck obstruction: Secondary | ICD-10-CM | POA: Diagnosis not present

## 2023-09-27 DIAGNOSIS — N400 Enlarged prostate without lower urinary tract symptoms: Secondary | ICD-10-CM | POA: Diagnosis not present

## 2023-09-27 DIAGNOSIS — Z9889 Other specified postprocedural states: Secondary | ICD-10-CM | POA: Diagnosis not present

## 2023-09-27 DIAGNOSIS — R109 Unspecified abdominal pain: Secondary | ICD-10-CM | POA: Diagnosis not present

## 2023-09-27 DIAGNOSIS — R1032 Left lower quadrant pain: Secondary | ICD-10-CM | POA: Diagnosis not present

## 2023-09-27 DIAGNOSIS — N4281 Prostatodynia syndrome: Secondary | ICD-10-CM | POA: Diagnosis not present

## 2023-09-27 DIAGNOSIS — K5730 Diverticulosis of large intestine without perforation or abscess without bleeding: Secondary | ICD-10-CM | POA: Diagnosis not present

## 2023-10-02 DIAGNOSIS — M5116 Intervertebral disc disorders with radiculopathy, lumbar region: Secondary | ICD-10-CM | POA: Diagnosis not present

## 2023-10-14 DIAGNOSIS — G4733 Obstructive sleep apnea (adult) (pediatric): Secondary | ICD-10-CM | POA: Diagnosis not present

## 2023-12-26 DIAGNOSIS — M545 Low back pain, unspecified: Secondary | ICD-10-CM | POA: Diagnosis not present

## 2024-01-03 DIAGNOSIS — R7301 Impaired fasting glucose: Secondary | ICD-10-CM | POA: Diagnosis not present

## 2024-01-03 DIAGNOSIS — K219 Gastro-esophageal reflux disease without esophagitis: Secondary | ICD-10-CM | POA: Diagnosis not present

## 2024-01-03 DIAGNOSIS — N401 Enlarged prostate with lower urinary tract symptoms: Secondary | ICD-10-CM | POA: Diagnosis not present

## 2024-01-03 DIAGNOSIS — N529 Male erectile dysfunction, unspecified: Secondary | ICD-10-CM | POA: Diagnosis not present

## 2024-01-03 DIAGNOSIS — E039 Hypothyroidism, unspecified: Secondary | ICD-10-CM | POA: Diagnosis not present

## 2024-01-10 DIAGNOSIS — N529 Male erectile dysfunction, unspecified: Secondary | ICD-10-CM | POA: Diagnosis not present

## 2024-01-10 DIAGNOSIS — R972 Elevated prostate specific antigen [PSA]: Secondary | ICD-10-CM | POA: Diagnosis not present

## 2024-01-10 DIAGNOSIS — R350 Frequency of micturition: Secondary | ICD-10-CM | POA: Diagnosis not present

## 2024-01-10 DIAGNOSIS — N401 Enlarged prostate with lower urinary tract symptoms: Secondary | ICD-10-CM | POA: Diagnosis not present

## 2024-01-16 DIAGNOSIS — U071 COVID-19: Secondary | ICD-10-CM | POA: Diagnosis not present

## 2024-01-16 DIAGNOSIS — R739 Hyperglycemia, unspecified: Secondary | ICD-10-CM | POA: Diagnosis not present

## 2024-01-16 DIAGNOSIS — R058 Other specified cough: Secondary | ICD-10-CM | POA: Diagnosis not present

## 2024-01-16 DIAGNOSIS — I10 Essential (primary) hypertension: Secondary | ICD-10-CM | POA: Diagnosis not present

## 2024-01-27 DIAGNOSIS — N529 Male erectile dysfunction, unspecified: Secondary | ICD-10-CM | POA: Diagnosis not present

## 2024-02-07 DIAGNOSIS — N401 Enlarged prostate with lower urinary tract symptoms: Secondary | ICD-10-CM | POA: Diagnosis not present

## 2024-02-07 DIAGNOSIS — N529 Male erectile dysfunction, unspecified: Secondary | ICD-10-CM | POA: Diagnosis not present

## 2024-02-07 DIAGNOSIS — K219 Gastro-esophageal reflux disease without esophagitis: Secondary | ICD-10-CM | POA: Diagnosis not present

## 2024-02-07 DIAGNOSIS — Z13220 Encounter for screening for lipoid disorders: Secondary | ICD-10-CM | POA: Diagnosis not present

## 2024-02-07 DIAGNOSIS — Z0000 Encounter for general adult medical examination without abnormal findings: Secondary | ICD-10-CM | POA: Diagnosis not present

## 2024-02-07 DIAGNOSIS — E039 Hypothyroidism, unspecified: Secondary | ICD-10-CM | POA: Diagnosis not present

## 2024-02-21 ENCOUNTER — Ambulatory Visit
Admission: EM | Admit: 2024-02-21 | Discharge: 2024-02-21 | Disposition: A | Discharge status: Home / Self Care | Attending: Family Medicine | Admitting: Family Medicine

## 2024-02-21 DIAGNOSIS — M5416 Radiculopathy, lumbar region: Secondary | ICD-10-CM | POA: Diagnosis not present

## 2024-02-21 MED ORDER — METHOCARBAMOL 500 MG PO TABS: ORAL_TABLET | ORAL | 0 refills | Status: DC

## 2024-02-21 MED ORDER — METHYLPREDNISOLONE SODIUM SUCC 125 MG IJ SOLR
125.0000 mg | Freq: Once | INTRAMUSCULAR | Status: AC
  Administered 2024-02-21: 125 mg via INTRAMUSCULAR

## 2024-02-21 MED ORDER — PREDNISONE 10 MG (21) PO TBPK: ORAL_TABLET | ORAL | 0 refills | Status: DC

## 2024-02-21 NOTE — ED Provider Notes (Signed)
 [name]    CSN: [id number] Arrival date & time: 02/21/24  1556      History   Chief Complaint Chief Complaint  Patient presents with   Back Pain    HPI Wayne Gross is a 63 y.o. male.   After working in his yard last week, patient developed right lower back pain that has been radiating down his right posterior leg to the knee.   He denies bowel or bladder dysfunction, and no saddle numbness.  He has a history of left L4-L5 lumbar decompression 08/25/19, and he states that his present symptoms are similar to the left side pain he had prior to surgery  The history is provided by the patient.  Back Pain Location:  Lumbar spine Quality:  Aching Radiates to:  R thigh Pain severity:  Moderate Pain is:  Same all the time Onset quality:  Sudden Duration:  1 week Timing:  Intermittent Progression:  Unchanged Chronicity:  New Context: lifting heavy objects   Relieved by: bending forward. Worsened by:  Movement Ineffective treatments:  Heating pad Associated symptoms: no abdominal pain, no bladder incontinence, no bowel incontinence, no dysuria, no fever, no numbness, no paresthesias, no perianal numbness and no weakness   Risk factors: obesity     Past Medical History:  Diagnosis Date   Asthma    Diverticulitis    GERD (gastroesophageal reflux disease)    Hypertension    Thyroid disease     Patient Active Problem List   Diagnosis Date Noted   Perforated diverticulum 09/06/2021   Sepsis (HCC) 09/02/2021   Thyroid disease    GERD (gastroesophageal reflux disease)    Hypertension    BPH (benign prostatic hyperplasia)    Acute diverticulitis 09/01/2021    Past Surgical History:  Procedure Laterality Date   ROTATOR CUFF REPAIR Right        Home Medications    Prior to Admission medications   Medication Sig Start Date End Date Taking? Authorizing Provider  methocarbamol (ROBAXIN) 500 MG tablet Take one tab PO 3 times daily PRN 02/21/24  Yes  Anina Schnake, Lenis Quin, MD  predniSONE (STERAPRED UNI-PAK 21 TAB) 10 MG (21) TBPK tablet Take by mouth daily. Take 6 tabs by mouth daily for 2 days, then 5 tabs for 2 days, then 4 tabs for 2 days, then 3 tabs for 2 days, 2 tabs for 2 days, then 1 tab by mouth daily for 2 days 02/21/24  Yes Leon Rajas, MD  albuterol (VENTOLIN HFA) 108 (90 Base) MCG/ACT inhaler Inhale 1-2 puffs into the lungs every 6 (six) hours as needed for wheezing or shortness of breath. 01/27/23   Stephany Ehrich, MD  alfuzosin (UROXATRAL) 10 MG 24 hr tablet Take 10 mg by mouth daily with breakfast.    [provider]  furosemide (LASIX) 20 MG tablet Take 20 mg by mouth daily. 01/17/23   [provider]  lansoprazole (PREVACID) 15 MG capsule Take 15 mg by mouth daily at 12 noon.    [provider]  levothyroxine (SYNTHROID) 175 MCG tablet Take 175 mcg by mouth daily before breakfast.     [provider]  olmesartan-hydrochlorothiazide (BENICAR HCT) 40-25 MG tablet Take 1 tablet by mouth daily.    [provider]    Family History Family History  Problem Relation Age of Onset   Cancer Mother    Healthy Father     Social History Social History   Tobacco Use   Smoking  status: Never   Smokeless tobacco: Never  Vaping Use   Vaping status: Never Used  Substance Use Topics   Alcohol use: Yes    Comment: socially   Drug use: Never     Allergies   Patient has no known allergies.   Review of Systems Review of Systems  Constitutional:  Positive for activity change. Negative for diaphoresis, fatigue and fever.  Gastrointestinal:  Negative for abdominal pain and bowel incontinence.  Genitourinary:  Negative for bladder incontinence, dysuria and frequency.  Musculoskeletal:  Positive for back pain.  Neurological:  Negative for weakness, numbness and paresthesias.  All other systems reviewed and are negative.    Physical Exam Triage Vital Signs ED Triage Vitals  [02/21/24 1649]  Encounter Vitals Group     BP 138/78     Systolic BP Percentile      Diastolic BP Percentile      Pulse Rate 93     Resp 19     Temp 98.5 F (36.9 C)     Temp src      SpO2 98 %     Weight      Height      Head Circumference      Peak Flow      Pain Score 9     Pain Loc      Pain Education      Exclude from Growth Chart    No data found.  Updated Vital Signs BP 138/78   Pulse 93   Temp 98.5 F (36.9 C)   Resp 19   SpO2 98%   Visual Acuity Right Eye Distance:   Left Eye Distance:   Bilateral Distance:    Right Eye Near:   Left Eye Near:    Bilateral Near:     Physical Exam Vitals and nursing note reviewed.  Constitutional:      General: He is not in acute distress. HENT:     Head: Normocephalic.     Mouth/Throat:     Mouth: Mucous membranes are moist.  Eyes:     Conjunctiva/sclera: Conjunctivae normal.     Pupils: Pupils are equal, round, and reactive to light.  Cardiovascular:     Rate and Rhythm: Normal rate and regular rhythm.     Heart sounds: Normal heart sounds.  Pulmonary:     Breath sounds: Normal breath sounds.  Abdominal:     Palpations: Abdomen is soft.     Tenderness: There is no abdominal tenderness.  Musculoskeletal:     Cervical back: Normal range of motion.       Back:     Right lower leg: No edema.     Left lower leg: No edema.     Comments: Back:  Range of motion relatively well preserved. Tenderness in the midline and right paraspinous muscles from L4 to Sacral area.  Strength and sensation in the lower extremities is normal.  Patellar and achilles reflexes are normal   Skin:    General: Skin is warm and dry.     Findings: No rash.  Neurological:     Mental Status: He is alert and oriented to person, place, and time.      UC Treatments / Results  Labs (all labs ordered are listed, but only abnormal results are displayed) Labs Reviewed - No data to display  EKG   Radiology No results  found.  Procedures Procedures (including critical care time)  Medications Ordered in UC Medications  methylPREDNISolone sodium succinate (  SOLU-MEDROL) 125 mg/2 mL injection 125 mg (has no administration in time range)    Initial Impression / Assessment and Plan / UC Course  I have reviewed the triage vital signs and the nursing notes.  Pertinent labs & imaging results that were available during my care of the patient were reviewed by me and considered in my medical decision making (see chart for details).    Administered Solumedrol 125mg  IM.  Begin prednisone burst/taper tomorrow.  Rx for Robaxin 500mg . Followup with Family Doctor if not improved in one week.    Final Clinical Impressions(s) / UC Diagnoses   Final diagnoses:  Acute lumbar radiculopathy     Discharge Instructions      Apply ice pack to mid-lower back for 20 to 30 minutes, 3 to 4 times daily  Continue until pain decreases.  Avoid heavy lifting.    ED Prescriptions     Medication Sig Dispense Auth. Provider   predniSONE (STERAPRED UNI-PAK 21 TAB) 10 MG (21) TBPK tablet Take by mouth daily. Take 6 tabs by mouth daily for 2 days, then 5 tabs for 2 days, then 4 tabs for 2 days, then 3 tabs for 2 days, 2 tabs for 2 days, then 1 tab by mouth daily for 2 days 42 tablet Frederika Hukill, Lenis Quin, MD   methocarbamol (ROBAXIN) 500 MG tablet Take one tab PO 3 times daily PRN 30 tablet Leon Rajas, MD         Leon Rajas, MD 02/22/24 2115

## 2024-02-21 NOTE — ED Triage Notes (Signed)
 Pt presents to uc with co back pain since last week when he was working in the yard. Pt reports right side radiating down the leg and reports history of budgling disck.

## 2024-02-21 NOTE — Discharge Instructions (Addendum)
 Apply ice pack to mid-lower back for 20 to 30 minutes, 3 to 4 times daily  Continue until pain decreases.  Avoid heavy lifting.

## 2024-05-22 DIAGNOSIS — K219 Gastro-esophageal reflux disease without esophagitis: Secondary | ICD-10-CM | POA: Diagnosis not present

## 2024-05-22 DIAGNOSIS — Z0000 Encounter for general adult medical examination without abnormal findings: Secondary | ICD-10-CM | POA: Diagnosis not present

## 2024-05-22 DIAGNOSIS — S39012A Strain of muscle, fascia and tendon of lower back, initial encounter: Secondary | ICD-10-CM | POA: Diagnosis not present

## 2024-05-22 DIAGNOSIS — N402 Nodular prostate without lower urinary tract symptoms: Secondary | ICD-10-CM | POA: Diagnosis not present

## 2024-05-22 DIAGNOSIS — Z6834 Body mass index (BMI) 34.0-34.9, adult: Secondary | ICD-10-CM | POA: Diagnosis not present

## 2024-05-22 DIAGNOSIS — I10 Essential (primary) hypertension: Secondary | ICD-10-CM | POA: Diagnosis not present

## 2024-05-22 DIAGNOSIS — E669 Obesity, unspecified: Secondary | ICD-10-CM | POA: Diagnosis not present

## 2024-05-26 DIAGNOSIS — M519 Unspecified thoracic, thoracolumbar and lumbosacral intervertebral disc disorder: Secondary | ICD-10-CM | POA: Diagnosis not present

## 2024-05-26 DIAGNOSIS — E6609 Other obesity due to excess calories: Secondary | ICD-10-CM | POA: Diagnosis not present

## 2024-05-26 DIAGNOSIS — Z6833 Body mass index (BMI) 33.0-33.9, adult: Secondary | ICD-10-CM | POA: Diagnosis not present

## 2024-05-26 DIAGNOSIS — M5116 Intervertebral disc disorders with radiculopathy, lumbar region: Secondary | ICD-10-CM | POA: Diagnosis not present

## 2024-07-10 DIAGNOSIS — N529 Male erectile dysfunction, unspecified: Secondary | ICD-10-CM | POA: Diagnosis not present

## 2024-07-10 DIAGNOSIS — N401 Enlarged prostate with lower urinary tract symptoms: Secondary | ICD-10-CM | POA: Diagnosis not present

## 2024-07-10 DIAGNOSIS — R350 Frequency of micturition: Secondary | ICD-10-CM | POA: Diagnosis not present

## 2024-07-10 DIAGNOSIS — R972 Elevated prostate specific antigen [PSA]: Secondary | ICD-10-CM | POA: Diagnosis not present

## 2024-07-14 ENCOUNTER — Ambulatory Visit
Admission: EM | Admit: 2024-07-14 | Discharge: 2024-07-14 | Disposition: A | Discharge status: Home / Self Care | Attending: Family Medicine | Admitting: Family Medicine

## 2024-07-14 DIAGNOSIS — M5416 Radiculopathy, lumbar region: Secondary | ICD-10-CM | POA: Diagnosis not present

## 2024-07-14 MED ORDER — PREDNISONE 10 MG (21) PO TBPK: ORAL_TABLET | Freq: Every day | ORAL | 0 refills | Status: AC

## 2024-07-14 MED ORDER — METHYLPREDNISOLONE SODIUM SUCC 125 MG IJ SOLR
125.0000 mg | Freq: Once | INTRAMUSCULAR | Status: AC
  Administered 2024-07-14: 125 mg via INTRAMUSCULAR

## 2024-07-14 NOTE — ED Triage Notes (Addendum)
 Pt c/o back pain x 4 days. Says he was working in his yard when he felt something pull in his lower back. Hx of back surgery on L4 & L5. Seen on 4/11 earlier this year for same issue.

## 2024-07-14 NOTE — ED Provider Notes (Signed)
 [facility]    CSN: [id number] Arrival date & time: 07/14/24  1845      History   Chief Complaint Chief Complaint  Patient presents with   Back Pain    HPI Wayne Gross is a 63 y.o. male.   HPI 63 year old male presents with back pain x 4 days secondary to working in his yard.  Patient reports history of back surgery on L4/L5.SABRA  PMH significant for obesity, HTN, and thyroid  disease  Past Medical History:  Diagnosis Date   Asthma    Diverticulitis    GERD (gastroesophageal reflux disease)    Hypertension    Thyroid  disease     Patient Active Problem List   Diagnosis Date Noted   Perforated diverticulum 09/06/2021   Sepsis (HCC) 09/02/2021   Thyroid  disease    GERD (gastroesophageal reflux disease)    Hypertension    BPH (benign prostatic hyperplasia)    Acute diverticulitis 09/01/2021    Past Surgical History:  Procedure Laterality Date   ROTATOR CUFF REPAIR Right        Home Medications    Prior to Admission medications   Medication Sig Start Date End Date Taking? Authorizing Provider  predniSONE  (STERAPRED UNI-PAK 21 TAB) 10 MG (21) TBPK tablet Take by mouth daily. Take 6 tabs by mouth daily  for 2 days, then 5 tabs for 2 days, then 4 tabs for 2 days, then 3 tabs for 2 days, 2 tabs for 2 days, then 1 tab by mouth daily for 2 days 07/14/24  Yes Teddy Sharper, FNP  albuterol  (VENTOLIN  HFA) 108 (90 Base) MCG/ACT inhaler Inhale 1-2 puffs into the lungs every 6 (six) hours as needed for wheezing or shortness of breath. 01/27/23   Maranda Jamee Jacob, MD  alfuzosin  (UROXATRAL ) 10 MG 24 hr tablet Take 10 mg by mouth daily with breakfast.    [provider]  furosemide (LASIX) 20 MG tablet Take 20 mg by mouth daily. 01/17/23   [provider]  lansoprazole (PREVACID) 15 MG capsule Take 15 mg by mouth daily at 12 noon.    [provider]  levothyroxine  (SYNTHROID ) 175 MCG tablet Take 175 mcg by mouth daily before breakfast.      [provider]  olmesartan -hydrochlorothiazide  (BENICAR  HCT) 40-25 MG tablet Take 1 tablet by mouth daily.    [provider]    Family History Family History  Problem Relation Age of Onset   Cancer Mother    Healthy Father     Social History Social History   Tobacco Use   Smoking status: Never   Smokeless tobacco: Never  Vaping Use   Vaping status: Never Used  Substance Use Topics   Alcohol use: Yes    Comment: socially   Drug use: Never     Allergies   Patient has no known allergies.   Review of Systems Review of Systems   Physical Exam Triage Vital Signs ED Triage Vitals  Encounter Vitals Group     BP 07/14/24 1851 125/79     Girls Systolic BP Percentile --      Girls Diastolic BP Percentile --      Boys Systolic BP Percentile --      Boys Diastolic BP Percentile --      Pulse Rate 07/14/24 1851 (!) 105     Resp 07/14/24 1851 17     Temp 07/14/24 1851 98.1 F (36.7 C)     Temp Source 07/14/24 1851 Oral  SpO2 07/14/24 1851 95 %     Weight --      Height --      Head Circumference --      Peak Flow --      Pain Score 07/14/24 1853 10     Pain Loc --      Pain Education --      Exclude from Growth Chart --    No data found.  Updated Vital Signs BP 125/79 (BP Location: Right Arm)   Pulse (!) 105   Temp 98.1 F (36.7 C) (Oral)   Resp 17   SpO2 95%   Visual Acuity Right Eye Distance:   Left Eye Distance:   Bilateral Distance:    Right Eye Near:   Left Eye Near:    Bilateral Near:     Physical Exam Vitals and nursing note reviewed.  Constitutional:      Appearance: Normal appearance. He is obese.  HENT:     Head: Normocephalic and atraumatic.     Mouth/Throat:     Mouth: Mucous membranes are moist.     Pharynx: Oropharynx is clear.  Eyes:     Extraocular Movements: Extraocular movements intact.     Pupils: Pupils are equal, round, and reactive to light.  Cardiovascular:     Rate and Rhythm: Normal rate and  regular rhythm.     Pulses: Normal pulses.     Heart sounds: Normal heart sounds.  Pulmonary:     Effort: Pulmonary effort is normal.     Breath sounds: Normal breath sounds. No wheezing, rhonchi or rales.  Musculoskeletal:        General: Normal range of motion.  Skin:    General: Skin is warm and dry.  Neurological:     General: No focal deficit present.     Mental Status: He is alert and oriented to person, place, and time. Mental status is at baseline.  Psychiatric:        Mood and Affect: Mood normal.        Behavior: Behavior normal.      UC Treatments / Results  Labs (all labs ordered are listed, but only abnormal results are displayed) Labs Reviewed - No data to display  EKG   Radiology No results found.  Procedures Procedures (including critical care time)  Medications Ordered in UC Medications  methylPREDNISolone  sodium succinate (SOLU-MEDROL ) 125 mg/2 mL injection 125 mg (125 mg Intramuscular Given 07/14/24 1917)    Initial Impression / Assessment and Plan / UC Course  I have reviewed the triage vital signs and the nursing notes.  Pertinent labs & imaging results that were available during my care of the patient were reviewed by me and considered in my medical decision making (see chart for details).     MDM: 1.  Acute lumbar radiculopathy-IM Solu-Medrol  125 mg given once in clinic and prior to discharge, Rx'd Sterapred Unipak (42 tab 10 mg taper).  Advised patient to take medication as directed with food to completion.  Encouraged increase daily water intake to 64 ounces per day while taking this medication.  Advised if symptoms worsen and/or unresolved please follow-up with your PCP or here for further evaluation Final Clinical Impressions(s) / UC Diagnoses   Final diagnoses:  Acute lumbar radiculopathy     Discharge Instructions      Advised patient to take medication as directed with food to completion.  Encouraged increase daily water intake to  64 ounces per day while taking this  medication.  Advised if symptoms worsen and/or unresolved please follow-up with your PCP or here for further evaluation     ED Prescriptions     Medication Sig Dispense Auth. Provider   predniSONE  (STERAPRED UNI-PAK 21 TAB) 10 MG (21) TBPK tablet Take by mouth daily. Take 6 tabs by mouth daily  for 2 days, then 5 tabs for 2 days, then 4 tabs for 2 days, then 3 tabs for 2 days, 2 tabs for 2 days, then 1 tab by mouth daily for 2 days 42 tablet Teddy Sharper, FNP      PDMP not reviewed this encounter.   Teddy Sharper, FNP 07/14/24 [phone]

## 2024-07-14 NOTE — Discharge Instructions (Addendum)
 Advised patient to take medication as directed with food to completion.  Encouraged increase daily water intake to 64 ounces per day while taking this medication. Advised if symptoms worsen and/or unresolved please follow-up with your PCP or here for further evaluation.

## 2024-07-15 ENCOUNTER — Telehealth: Payer: Self-pay

## 2024-07-15 NOTE — Telephone Encounter (Signed)
 Call made to pt asking how he's doing since UC visit. Pt feeling some improvement already. Lots of praise for the UC as a whole. Encouraged to leave [employer] review if he feels so inclined. Advised to call back anytime with any questions or concerns.

## 2024-07-28 DIAGNOSIS — E039 Hypothyroidism, unspecified: Secondary | ICD-10-CM | POA: Diagnosis not present

## 2024-08-27 DIAGNOSIS — M519 Unspecified thoracic, thoracolumbar and lumbosacral intervertebral disc disorder: Secondary | ICD-10-CM | POA: Diagnosis not present
